# Patient Record
Sex: Female | Born: 1970 | Race: Black or African American | Hispanic: No | Smoking: Former smoker
Health system: Southern US, Community
[De-identification: ages and names within clinical notes are randomized; demographics above are authoritative.]

## PROBLEM LIST (undated history)

## (undated) DIAGNOSIS — E119 Type 2 diabetes mellitus without complications: Secondary | ICD-10-CM

## (undated) DIAGNOSIS — J45909 Unspecified asthma, uncomplicated: Secondary | ICD-10-CM

## (undated) DIAGNOSIS — F319 Bipolar disorder, unspecified: Secondary | ICD-10-CM

## (undated) DIAGNOSIS — I10 Essential (primary) hypertension: Secondary | ICD-10-CM

---

## 2014-12-21 ENCOUNTER — Emergency Department (HOSPITAL_COMMUNITY): Payer: Medicaid Other

## 2014-12-21 ENCOUNTER — Emergency Department (HOSPITAL_COMMUNITY)
Admission: EM | Admit: 2014-12-21 | Discharge: 2014-12-22 | Disposition: A | Discharge status: Other Acute Inpatient Hospital | Payer: Medicaid Other | Attending: Emergency Medicine | Admitting: Emergency Medicine

## 2014-12-21 ENCOUNTER — Encounter (HOSPITAL_COMMUNITY): Payer: Self-pay

## 2014-12-21 DIAGNOSIS — F1410 Cocaine abuse, uncomplicated: Secondary | ICD-10-CM | POA: Diagnosis not present

## 2014-12-21 DIAGNOSIS — F3163 Bipolar disorder, current episode mixed, severe, without psychotic features: Secondary | ICD-10-CM | POA: Diagnosis not present

## 2014-12-21 DIAGNOSIS — J45901 Unspecified asthma with (acute) exacerbation: Secondary | ICD-10-CM | POA: Insufficient documentation

## 2014-12-21 DIAGNOSIS — R4689 Other symptoms and signs involving appearance and behavior: Secondary | ICD-10-CM

## 2014-12-21 DIAGNOSIS — F3160 Bipolar disorder, current episode mixed, unspecified: Secondary | ICD-10-CM | POA: Diagnosis not present

## 2014-12-21 DIAGNOSIS — Y998 Other external cause status: Secondary | ICD-10-CM | POA: Diagnosis not present

## 2014-12-21 DIAGNOSIS — Y9389 Activity, other specified: Secondary | ICD-10-CM | POA: Insufficient documentation

## 2014-12-21 DIAGNOSIS — Y92009 Unspecified place in unspecified non-institutional (private) residence as the place of occurrence of the external cause: Secondary | ICD-10-CM | POA: Insufficient documentation

## 2014-12-21 DIAGNOSIS — F4310 Post-traumatic stress disorder, unspecified: Secondary | ICD-10-CM | POA: Diagnosis present

## 2014-12-21 DIAGNOSIS — Z87891 Personal history of nicotine dependence: Secondary | ICD-10-CM | POA: Insufficient documentation

## 2014-12-21 DIAGNOSIS — R0600 Dyspnea, unspecified: Secondary | ICD-10-CM | POA: Diagnosis not present

## 2014-12-21 DIAGNOSIS — F1310 Sedative, hypnotic or anxiolytic abuse, uncomplicated: Secondary | ICD-10-CM | POA: Diagnosis not present

## 2014-12-21 DIAGNOSIS — F911 Conduct disorder, childhood-onset type: Secondary | ICD-10-CM | POA: Diagnosis not present

## 2014-12-21 DIAGNOSIS — S199XXA Unspecified injury of neck, initial encounter: Secondary | ICD-10-CM | POA: Diagnosis not present

## 2014-12-21 DIAGNOSIS — Z79899 Other long term (current) drug therapy: Secondary | ICD-10-CM | POA: Diagnosis not present

## 2014-12-21 DIAGNOSIS — I10 Essential (primary) hypertension: Secondary | ICD-10-CM | POA: Insufficient documentation

## 2014-12-21 DIAGNOSIS — E119 Type 2 diabetes mellitus without complications: Secondary | ICD-10-CM | POA: Insufficient documentation

## 2014-12-21 DIAGNOSIS — R0602 Shortness of breath: Secondary | ICD-10-CM | POA: Diagnosis present

## 2014-12-21 DIAGNOSIS — R0603 Acute respiratory distress: Secondary | ICD-10-CM

## 2014-12-21 DIAGNOSIS — R45850 Homicidal ideations: Secondary | ICD-10-CM | POA: Diagnosis not present

## 2014-12-21 HISTORY — DX: Type 2 diabetes mellitus without complications: E11.9

## 2014-12-21 HISTORY — DX: Unspecified asthma, uncomplicated: J45.909

## 2014-12-21 HISTORY — DX: Bipolar disorder, unspecified: F31.9

## 2014-12-21 HISTORY — DX: Essential (primary) hypertension: I10

## 2014-12-21 LAB — I-STAT TROPONIN, ED: Troponin i, poc: 0.01 ng/mL (ref 0.00–0.08)

## 2014-12-21 LAB — BASIC METABOLIC PANEL
ANION GAP: 11 (ref 5–15)
BUN: 16 mg/dL (ref 6–20)
CO2: 23 mmol/L (ref 22–32)
Calcium: 9.6 mg/dL (ref 8.9–10.3)
Chloride: 105 mmol/L (ref 101–111)
Creatinine, Ser: 1.03 mg/dL — ABNORMAL HIGH (ref 0.44–1.00)
GFR calc Af Amer: >60 mL/min (ref >60)
GLUCOSE: 109 mg/dL — AB (ref 65–99)
POTASSIUM: 4.3 mmol/L (ref 3.5–5.1)
Sodium: 139 mmol/L (ref 135–145)

## 2014-12-21 LAB — CBC
HEMATOCRIT: 41 % (ref 36.0–46.0)
HEMOGLOBIN: 13.4 g/dL (ref 12.0–15.0)
MCH: 28.9 pg (ref 26.0–34.0)
MCHC: 32.7 g/dL (ref 30.0–36.0)
MCV: 88.6 fL (ref 78.0–100.0)
Platelets: 306 10*3/uL (ref 150–400)
RBC: 4.63 MIL/uL (ref 3.87–5.11)
RDW: 13 % (ref 11.5–15.5)
WBC: 9.1 10*3/uL (ref 4.0–10.5)

## 2014-12-21 MED ORDER — METFORMIN HCL 500 MG PO TABS
500.0000 mg | ORAL_TABLET | Freq: Every day | ORAL | Status: DC
  Administered 2014-12-22: 500 mg via ORAL
  Filled 2014-12-21 (×2): qty 1

## 2014-12-21 MED ORDER — MONTELUKAST SODIUM 10 MG PO TABS
10.0000 mg | ORAL_TABLET | Freq: Every evening | ORAL | Status: DC
  Administered 2014-12-21: 10 mg via ORAL
  Filled 2014-12-21 (×3): qty 1

## 2014-12-21 MED ORDER — ALBUTEROL SULFATE HFA 108 (90 BASE) MCG/ACT IN AERS: 1.0000 | INHALATION_SPRAY | RESPIRATORY_TRACT | Status: DC | PRN

## 2014-12-21 MED ORDER — GABAPENTIN 300 MG PO CAPS
300.0000 mg | ORAL_CAPSULE | Freq: Three times a day (TID) | ORAL | Status: DC
  Administered 2014-12-21 – 2014-12-22 (×4): 300 mg via ORAL
  Filled 2014-12-21 (×4): qty 1

## 2014-12-21 MED ORDER — LORAZEPAM 2 MG/ML IJ SOLN
1.0000 mg | Freq: Once | INTRAMUSCULAR | Status: AC
  Administered 2014-12-21: 1 mg via INTRAMUSCULAR
  Filled 2014-12-21: qty 1

## 2014-12-21 MED ORDER — ALBUTEROL SULFATE (2.5 MG/3ML) 0.083% IN NEBU
5.0000 mg | INHALATION_SOLUTION | Freq: Once | RESPIRATORY_TRACT | Status: AC
  Administered 2014-12-21: 5 mg via RESPIRATORY_TRACT
  Filled 2014-12-21: qty 6

## 2014-12-21 MED ORDER — HYDROCHLOROTHIAZIDE 25 MG PO TABS
25.0000 mg | ORAL_TABLET | Freq: Every day | ORAL | Status: DC
  Administered 2014-12-22: 25 mg via ORAL
  Filled 2014-12-21: qty 1

## 2014-12-21 MED ORDER — PANTOPRAZOLE SODIUM 40 MG PO TBEC
40.0000 mg | DELAYED_RELEASE_TABLET | Freq: Every day | ORAL | Status: DC
  Administered 2014-12-21 – 2014-12-22 (×2): 40 mg via ORAL
  Filled 2014-12-21 (×3): qty 1

## 2014-12-21 MED ORDER — MOMETASONE FURO-FORMOTEROL FUM 200-5 MCG/ACT IN AERO
2.0000 | INHALATION_SPRAY | Freq: Two times a day (BID) | RESPIRATORY_TRACT | Status: DC
  Filled 2014-12-21 (×2): qty 8.8

## 2014-12-21 MED ORDER — OXYCODONE HCL 5 MG PO TABS
5.0000 mg | ORAL_TABLET | Freq: Four times a day (QID) | ORAL | Status: DC | PRN
  Administered 2014-12-22: 5 mg via ORAL
  Filled 2014-12-21 (×2): qty 1

## 2014-12-21 MED ORDER — CLONAZEPAM 0.5 MG PO TABS
1.0000 mg | ORAL_TABLET | Freq: Two times a day (BID) | ORAL | Status: DC | PRN
  Filled 2014-12-21: qty 2

## 2014-12-21 NOTE — ED Notes (Signed)
Pt ambulated to the bathroom without assiatance

## 2014-12-21 NOTE — ED Notes (Signed)
Upon completing Triage, Pt began complaining of central chest pain.  Pt has denied chest pain w/ EMS.  Pt noted to be thrashing around in the chair and continually stating that she can not breathe.  This RN unsuccessfully attempted to calm and reassure the Pt that her oxygen level is 95%.

## 2014-12-21 NOTE — ED Notes (Signed)
Pt ambulated to bathroom and back to bed without assistance.

## 2014-12-21 NOTE — ED Notes (Signed)
Pt resting comfortably in the hallway bed.  NAD noted.

## 2014-12-21 NOTE — ED Notes (Signed)
Pt's husband: [phone]
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
## 2014-12-21 NOTE — ED Notes (Signed)
Pt [facility] to [facility] and [facility]. RN asked pt if she used CPAP or BiPap to sleep at night. Pt states "CPAP". When RN inquired as to if she needed it every night, pt states "Yes". Charge Nurse aware of statement. When pt informed that she will have to return to main ER, pt then began to recant her statement saying "I ain't sleeping in the hallway! You ain't taking me out there!". Pt irritable and argumentative with staff. Pt taken to Hallway C.
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
## 2014-12-21 NOTE — ED Notes (Signed)
Pt ambulated to bathroom without assistance. Ambulated with husband walking beside her.
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
## 2014-12-21 NOTE — Progress Notes (Signed)
Placed on auto titration BiPAP Imax 20 Emin 8 and 3L O2 bleed using full face mask. Did not tolerate nasal mask and asked for full face mask. Husband at bedside. Patient tolerated well and agrees the pressure and mask fit "feels okay". RN aware. RT will continue to follow.

## 2014-12-21 NOTE — ED Notes (Signed)
Pt ambulated to the bathroom with out assistance and got back in bed.

## 2014-12-21 NOTE — Progress Notes (Signed)
Visited with patient and her husband. Remains in the hallway. Patient asleep, moderate difficulty to awaken for conversation. Once awake, she is compliant and answers questions appropriately. She states she does use nocturnal CPAP but does not use it every night. Husband disagrees and states she wears it "alot" and sometimes during the day when she naps, as well as uses oxygen through it. The patient agrees that she uses nocturnal O2, which she states is "sometimes on 2L and other times up to 6L. The patient repositioned herself in the bed and drifted back to sleep. In conversing with her husband, he explains that his wife did not tolerate a CPAP mask of any kind well and therefore uses nasal pillows. He is aware that the pillow apparatus is not readily available in the hospital and [facility] past one night. He also expressed that the patient tolerated a nasal mask much better than a full face mask in the past. RN and charge RN both made aware of need for patient to be placed in a room. RT will continue to follow.

## 2014-12-21 NOTE — ED Notes (Signed)
Patient very dramatic. Yelling, would not hold still for ekg or IV start. Patient asked to be still multiple times and she pulled away when author attempted to start IV. Primary nurse made aware.

## 2014-12-22 ENCOUNTER — Inpatient Hospital Stay (HOSPITAL_COMMUNITY)
Admission: AD | Admit: 2014-12-22 | Discharge: 2014-12-27 | DRG: 885 | Disposition: A | Discharge status: Home / Self Care | Payer: MEDICAID | Source: Intra-hospital | Attending: Psychiatry | Admitting: Psychiatry

## 2014-12-22 ENCOUNTER — Encounter (HOSPITAL_COMMUNITY): Payer: Self-pay | Admitting: General Practice

## 2014-12-22 DIAGNOSIS — R45850 Homicidal ideations: Secondary | ICD-10-CM | POA: Diagnosis present

## 2014-12-22 DIAGNOSIS — Z87891 Personal history of nicotine dependence: Secondary | ICD-10-CM | POA: Diagnosis not present

## 2014-12-22 DIAGNOSIS — F3163 Bipolar disorder, current episode mixed, severe, without psychotic features: Secondary | ICD-10-CM

## 2014-12-22 DIAGNOSIS — F3160 Bipolar disorder, current episode mixed, unspecified: Secondary | ICD-10-CM | POA: Diagnosis present

## 2014-12-22 DIAGNOSIS — F4310 Post-traumatic stress disorder, unspecified: Secondary | ICD-10-CM | POA: Diagnosis present

## 2014-12-22 DIAGNOSIS — Z6841 Body Mass Index (BMI) 40.0 and over, adult: Secondary | ICD-10-CM

## 2014-12-22 DIAGNOSIS — E119 Type 2 diabetes mellitus without complications: Secondary | ICD-10-CM | POA: Diagnosis present

## 2014-12-22 DIAGNOSIS — F1420 Cocaine dependence, uncomplicated: Secondary | ICD-10-CM | POA: Diagnosis present

## 2014-12-22 DIAGNOSIS — E785 Hyperlipidemia, unspecified: Secondary | ICD-10-CM | POA: Diagnosis present

## 2014-12-22 DIAGNOSIS — F3164 Bipolar disorder, current episode mixed, severe, with psychotic features: Principal | ICD-10-CM | POA: Diagnosis present

## 2014-12-22 DIAGNOSIS — R4689 Other symptoms and signs involving appearance and behavior: Secondary | ICD-10-CM | POA: Insufficient documentation

## 2014-12-22 DIAGNOSIS — I10 Essential (primary) hypertension: Secondary | ICD-10-CM | POA: Diagnosis present

## 2014-12-22 DIAGNOSIS — R0600 Dyspnea, unspecified: Secondary | ICD-10-CM | POA: Diagnosis not present

## 2014-12-22 DIAGNOSIS — F319 Bipolar disorder, unspecified: Secondary | ICD-10-CM | POA: Diagnosis present

## 2014-12-22 LAB — URINE MICROSCOPIC-ADD ON

## 2014-12-22 LAB — URINALYSIS, ROUTINE W REFLEX MICROSCOPIC
Glucose, UA: NEGATIVE mg/dL
KETONES UR: NEGATIVE mg/dL
NITRITE: NEGATIVE
PROTEIN: NEGATIVE mg/dL
SPECIFIC GRAVITY, URINE: 1.028 (ref 1.005–1.030)
pH: 6 (ref 5.0–8.0)

## 2014-12-22 LAB — RAPID URINE DRUG SCREEN, HOSP PERFORMED
Amphetamines: NOT DETECTED
Barbiturates: NOT DETECTED
Benzodiazepines: POSITIVE — AB
COCAINE: POSITIVE — AB
OPIATES: NOT DETECTED
TETRAHYDROCANNABINOL: NOT DETECTED

## 2014-12-22 MED ORDER — MONTELUKAST SODIUM 10 MG PO TABS
10.0000 mg | ORAL_TABLET | Freq: Every evening | ORAL | Status: DC
  Administered 2014-12-22 – 2014-12-26 (×5): 10 mg via ORAL
  Filled 2014-12-22 (×8): qty 1

## 2014-12-22 MED ORDER — ALBUTEROL SULFATE HFA 108 (90 BASE) MCG/ACT IN AERS
1.0000 | INHALATION_SPRAY | RESPIRATORY_TRACT | Status: DC | PRN
  Administered 2014-12-26: 1 via RESPIRATORY_TRACT
  Filled 2014-12-22: qty 6.7

## 2014-12-22 MED ORDER — GABAPENTIN 300 MG PO CAPS
300.0000 mg | ORAL_CAPSULE | Freq: Three times a day (TID) | ORAL | Status: DC
  Administered 2014-12-22 – 2014-12-24 (×6): 300 mg via ORAL
  Filled 2014-12-22 (×12): qty 1

## 2014-12-22 MED ORDER — CITALOPRAM HYDROBROMIDE 20 MG PO TABS
20.0000 mg | ORAL_TABLET | Freq: Every day | ORAL | Status: DC
  Filled 2014-12-22: qty 1

## 2014-12-22 MED ORDER — ACETAMINOPHEN 325 MG PO TABS: 650.0000 mg | ORAL_TABLET | Freq: Four times a day (QID) | ORAL | Status: DC | PRN

## 2014-12-22 MED ORDER — TRAZODONE HCL 100 MG PO TABS: 100.0000 mg | ORAL_TABLET | Freq: Every day | ORAL | Status: DC

## 2014-12-22 MED ORDER — LOPERAMIDE HCL 2 MG PO CAPS
4.0000 mg | ORAL_CAPSULE | Freq: Once | ORAL | Status: AC
Start: 2014-12-22 — End: 2014-12-22
  Administered 2014-12-22: 4 mg via ORAL
  Filled 2014-12-22 (×2): qty 2

## 2014-12-22 MED ORDER — NAPROXEN 500 MG PO TABS
500.0000 mg | ORAL_TABLET | Freq: Two times a day (BID) | ORAL | Status: DC
  Administered 2014-12-22 – 2014-12-27 (×10): 500 mg via ORAL
  Filled 2014-12-22 (×15): qty 1

## 2014-12-22 MED ORDER — CITALOPRAM HYDROBROMIDE 20 MG PO TABS
20.0000 mg | ORAL_TABLET | Freq: Every day | ORAL | Status: DC
Start: 2014-12-22 — End: 2014-12-23
  Administered 2014-12-22: 20 mg via ORAL
  Filled 2014-12-22 (×4): qty 1

## 2014-12-22 MED ORDER — TRAZODONE HCL 100 MG PO TABS
100.0000 mg | ORAL_TABLET | Freq: Every day | ORAL | Status: DC
  Administered 2014-12-22: 100 mg via ORAL
  Filled 2014-12-22 (×4): qty 1

## 2014-12-22 MED ORDER — MAGNESIUM HYDROXIDE 400 MG/5ML PO SUSP
30.0000 mL | Freq: Every day | ORAL | Status: DC | PRN
  Administered 2014-12-26: 30 mL via ORAL
  Filled 2014-12-22: qty 30

## 2014-12-22 MED ORDER — ALUM & MAG HYDROXIDE-SIMETH 200-200-20 MG/5ML PO SUSP: 30.0000 mL | ORAL | Status: DC | PRN

## 2014-12-22 MED ORDER — LOPERAMIDE HCL 2 MG PO CAPS
2.0000 mg | ORAL_CAPSULE | ORAL | Status: DC | PRN
  Administered 2014-12-27 (×2): 2 mg via ORAL
  Filled 2014-12-22: qty 1
  Filled 2014-12-22: qty 2

## 2014-12-22 MED ORDER — METHOCARBAMOL 500 MG PO TABS
500.0000 mg | ORAL_TABLET | Freq: Four times a day (QID) | ORAL | Status: DC | PRN
  Administered 2014-12-23 – 2014-12-26 (×6): 500 mg via ORAL
  Filled 2014-12-22 (×6): qty 1

## 2014-12-22 MED ORDER — HYDROXYZINE HCL 50 MG PO TABS
50.0000 mg | ORAL_TABLET | Freq: Four times a day (QID) | ORAL | Status: DC | PRN
  Administered 2014-12-22: 50 mg via ORAL
  Filled 2014-12-22: qty 1

## 2014-12-22 MED ORDER — PANTOPRAZOLE SODIUM 40 MG PO TBEC
40.0000 mg | DELAYED_RELEASE_TABLET | Freq: Every day | ORAL | Status: DC
  Administered 2014-12-23 – 2014-12-27 (×5): 40 mg via ORAL
  Filled 2014-12-22 (×7): qty 1

## 2014-12-22 MED ORDER — MOMETASONE FURO-FORMOTEROL FUM 200-5 MCG/ACT IN AERO
2.0000 | INHALATION_SPRAY | Freq: Two times a day (BID) | RESPIRATORY_TRACT | Status: DC
  Administered 2014-12-22 – 2014-12-27 (×9): 2 via RESPIRATORY_TRACT
  Filled 2014-12-22 (×2): qty 8.8

## 2014-12-22 MED ORDER — METFORMIN HCL 500 MG PO TABS
500.0000 mg | ORAL_TABLET | Freq: Every day | ORAL | Status: DC
  Administered 2014-12-25 – 2014-12-27 (×2): 500 mg via ORAL
  Filled 2014-12-22 (×7): qty 1

## 2014-12-22 MED ORDER — HYDROCHLOROTHIAZIDE 25 MG PO TABS
25.0000 mg | ORAL_TABLET | Freq: Every day | ORAL | Status: DC
  Administered 2014-12-23 – 2014-12-27 (×5): 25 mg via ORAL
  Filled 2014-12-22 (×7): qty 1

## 2014-12-22 NOTE — ED Notes (Signed)
Previous departure charting was on the wrong patient

## 2014-12-22 NOTE — ED Notes (Signed)
Pt irritated that she will not have a shower in her room and that she "stinks."  Pt informed that there is a shower on the unit.  Pt continually stating that she [facility], because "they know me.  They know how to take care of me."  Pt and boyfriend informed that he could not bring her outside food or drinks and boyfriend verbalized understanding.  Pt upset that she can not have food from the vending machines.  Sts she is pre-diabetic and does not have diet restrictions.  As pt's boyfriend was directed to the lobby, Pt began stating "they better discharge me.  They aren't taking my husband from me.  He came [place] the way from [place].  I need him w/ me."  Pt's boyfriend informed this RN that he was needing to leave and "get some sleep."  Boyfriend seemed very agitated w/ the Pt.
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
## 2014-12-22 NOTE — ED Notes (Signed)
Conversation with providers to determine that patient is SAPPU appropriated. It is very well documented that patient ambulates independently without assistive devices and that she refused to were the CPAP during the night without difficulty.
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
## 2014-12-22 NOTE — ED Notes (Signed)
Pt ambulated to SAPPU w/o assistance.

## 2014-12-22 NOTE — Tx Team (Signed)
Initial Interdisciplinary Treatment Plan   PATIENT STRESSORS: Marital or family conflict Medication change or noncompliance   PATIENT STRENGTHS: Active sense of humor Communication skills   PROBLEM LIST: Problem List/Patient Goals Date to be addressed Date deferred Reason deferred Estimated date of resolution  Aggression 12/22/2014           Bipolar Disorder 12/22/2014           "get out of here"-referring to discharge 12/22/2014                              DISCHARGE CRITERIA:  Ability to meet basic life and health needs Improved stabilization in mood, thinking, and/or behavior Verbal commitment to aftercare and medication compliance  PRELIMINARY DISCHARGE PLAN: Outpatient therapy Participate in family therapy  PATIENT/FAMIILY INVOLVEMENT: This treatment plan has been presented to and reviewed with the patient, Sherryl MangesBeauica Mcclean.  The patient and family have been given the opportunity to ask questions and make suggestions.  Marzetta BoardDopson, Sharion Grieves E 12/22/2014, 7:02 PM

## 2014-12-22 NOTE — Progress Notes (Addendum)
pcp is [facility] [address] [place], [place] [phone]  Medicaid [place] Access Covered Patient [email or web address] USE THIS WEBSITE TO ASSIST WITH UNDERSTANDING YOUR COVERAGE, RENEW APPLICATION As a Medicaid client you MUST contact DSS/SSI each time you change address, move to another [place] county or another state to keep your address updated   Lorayne BenderRandolph, [place] Schedule an appointment as soon as possible for a visit This is your assigned Medicaid [place] access doctor If you prefer another contact DSS [phone] DSS assigned your doctor *You may receive a bill if you go to any family Dr not assigned to you [address] [facility] [place] [id number] [phone]

## 2014-12-22 NOTE — ED Notes (Addendum)
Pt requesting pain medication.  Pain medication was brought into the room and pt was sleeping.  Pain medication was returned. Pt observed to have taken off her bipap machine but is sleeping comfortably.  Easy rise and fall of chest. NAD noted.

## 2014-12-22 NOTE — ED Notes (Signed)
Pt is upset that we are not giving her all of her home medications.  This RN asked a Pharmacy Tech to reconcile medications.
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
## 2014-12-22 NOTE — Progress Notes (Signed)
Adult Psychoeducational Group Note  Date:  12/22/2014 Time:  9:10 PM  Group Topic/Focus:  Wrap-Up Group:   The focus of this group is to help patients review their daily goal of treatment and discuss progress on daily workbooks.  Participation Level:  Did Not Attend  Participation Quality:  Did not attend  Affect:  Did not attend  Cognitive:  Did not attend  Insight: None  Engagement in Group:  Did not attend  Modes of Intervention:  Did not attend  Additional Comments:  Patient did not attend wrap up group tonight.   [name] L [name] 12/22/2014, 9:10 PM

## 2014-12-22 NOTE — ED Notes (Addendum)
Author attempt to speak with [facility] provider, but they are rounding at this time. Patient was not accepted in [facility] today because sometimes ambulates with cane per notes. However, patient came to the department on yesterday and has not required any assistive devices. She ambulates independently. Patient was in [facility] last night and ambulation was not an issue at that time and has not been this admission. Patient stated that she wears CPAP at home which she refuses to wear here and it is not required nor needed at this time. Author will attempt to speak with provider again in regards to this patient. BH AC made aware.
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
## 2014-12-22 NOTE — ED Notes (Signed)
Pt continues to keep her bipap mask off.  Pt was asked if she would like to put the bipap mask on, she declined.

## 2014-12-22 NOTE — BH Assessment (Signed)
[facility] Assessment Progress Note  Per Thedore MinsMojeed Akintayo, MD, this pt requires psychiatric hospitalization at this time.  He has initiated IVC.  Thurman CoyerEric Kaplan, RN, [facility].  [facility].  Pt's nurse, Kendal Hymendie, has been notified.  She agrees to call report to [phone].  Pt is to be transported via Patent examinerlaw enforcement.  Doylene Canninghomas Shikita Vaillancourt, MA Triage Specialist [phone]

## 2014-12-23 ENCOUNTER — Encounter (HOSPITAL_COMMUNITY): Payer: Self-pay | Admitting: Psychiatry

## 2014-12-23 LAB — GLUCOSE, CAPILLARY: Glucose-Capillary: 135 mg/dL — ABNORMAL HIGH (ref 65–99)

## 2014-12-23 MED ORDER — AMLODIPINE BESYLATE 5 MG PO TABS
5.0000 mg | ORAL_TABLET | Freq: Every day | ORAL | Status: DC
  Administered 2014-12-24 – 2014-12-27 (×3): 5 mg via ORAL
  Filled 2014-12-23 (×6): qty 1

## 2014-12-23 MED ORDER — THIAMINE HCL 100 MG/ML IJ SOLN: 100.0000 mg | Freq: Once | INTRAMUSCULAR | Status: DC

## 2014-12-23 MED ORDER — LORAZEPAM 1 MG PO TABS: 1.0000 mg | ORAL_TABLET | Freq: Two times a day (BID) | ORAL | Status: DC

## 2014-12-23 MED ORDER — LORAZEPAM 1 MG PO TABS: 1.0000 mg | ORAL_TABLET | Freq: Every day | ORAL | Status: DC

## 2014-12-23 MED ORDER — LORAZEPAM 1 MG PO TABS: 1.0000 mg | ORAL_TABLET | Freq: Three times a day (TID) | ORAL | Status: DC

## 2014-12-23 MED ORDER — ADULT MULTIVITAMIN W/MINERALS CH
1.0000 | ORAL_TABLET | Freq: Every day | ORAL | Status: DC
  Administered 2014-12-23 – 2014-12-27 (×5): 1 via ORAL
  Filled 2014-12-23 (×8): qty 1

## 2014-12-23 MED ORDER — TRAZODONE HCL 50 MG PO TABS
50.0000 mg | ORAL_TABLET | Freq: Every evening | ORAL | Status: DC | PRN
  Administered 2014-12-23 – 2014-12-25 (×3): 50 mg via ORAL
  Filled 2014-12-23 (×3): qty 1

## 2014-12-23 MED ORDER — ONDANSETRON 4 MG PO TBDP: 4.0000 mg | ORAL_TABLET | Freq: Four times a day (QID) | ORAL | Status: AC | PRN

## 2014-12-23 MED ORDER — CITALOPRAM HYDROBROMIDE 10 MG PO TABS
10.0000 mg | ORAL_TABLET | Freq: Every day | ORAL | Status: DC
  Administered 2014-12-24: 10 mg via ORAL
  Filled 2014-12-23 (×4): qty 1

## 2014-12-23 MED ORDER — HYDROXYZINE HCL 25 MG PO TABS
25.0000 mg | ORAL_TABLET | Freq: Four times a day (QID) | ORAL | Status: AC | PRN
  Administered 2014-12-23 – 2014-12-25 (×3): 25 mg via ORAL
  Filled 2014-12-23 (×3): qty 1

## 2014-12-23 MED ORDER — LORAZEPAM 1 MG PO TABS: 1.0000 mg | ORAL_TABLET | Freq: Four times a day (QID) | ORAL | Status: DC

## 2014-12-23 MED ORDER — VITAMIN B-1 100 MG PO TABS
100.0000 mg | ORAL_TABLET | Freq: Every day | ORAL | Status: DC
  Administered 2014-12-24 – 2014-12-27 (×4): 100 mg via ORAL
  Filled 2014-12-23 (×6): qty 1

## 2014-12-23 MED ORDER — AMLODIPINE BESYLATE 10 MG PO TABS
10.0000 mg | ORAL_TABLET | Freq: Once | ORAL | Status: AC
  Administered 2014-12-23: 10 mg via ORAL
  Filled 2014-12-23 (×2): qty 1

## 2014-12-23 MED ORDER — LORAZEPAM 1 MG PO TABS
1.0000 mg | ORAL_TABLET | Freq: Four times a day (QID) | ORAL | Status: AC | PRN
  Administered 2014-12-26: 1 mg via ORAL
  Filled 2014-12-23: qty 1

## 2014-12-23 MED ORDER — LOPERAMIDE HCL 2 MG PO CAPS: 2.0000 mg | ORAL_CAPSULE | ORAL | Status: DC | PRN

## 2014-12-23 NOTE — [facility] Group Notes (Signed)
[facility] LCSW Group Therapy 12/23/2014 1:15 PM  Type of Therapy: Group Therapy- Feelings about Diagnosis  Pt did not attend, declined invitation.   Chad CordialLauren Carter, LCSWA 12/23/2014 2:56 PM

## 2014-12-23 NOTE — [facility] Group Notes (Signed)
[facility] Group Notes: (Nursing/MHT/Case Management/Adjunct)  Date: 12/23/2014  Time: 10:06 AM  Type of Therapy: Psychoeducational Skills  Participation Level: Did Not Attend  Participation Quality: N/A  Affect: N/A  Cognitive: N/A  Insight: None  Engagement in Group: None  Modes of Intervention: Discussion and Education  Summary of Progress/Problems: Patient was invited to group but chose not to attend.   Xitlally Mooneyham E 12/23/2014, 10:06 AM
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
## 2014-12-23 NOTE — Plan of Care (Signed)
Problem: Ineffective individual coping Goal: STG: Patient will remain free from self harm Outcome: Progressing Remains free from self harm at this time.

## 2014-12-23 NOTE — Tx Team (Signed)
Interdisciplinary Treatment Plan Update (Adult) Date: 12/23/2014   Date: 12/23/2014 3:22 PM  Progress in Treatment:  Attending groups: Candice [name] is new to milieu, continuing to assess  Participating in groups: Candice [name] is new to milieu, continuing to assess  Taking medication as prescribed: Yes  Tolerating medication: Yes  Family/Significant othe contact made: No, CSW assessing for appropriate contact Patient understands diagnosis: Continuing to assess Discussing patient identified problems/goals with staff: Yes  Medical problems stabilized or resolved: Yes  Denies suicidal/homicidal ideation: No, Candice [name] endorses HI toward brother Patient has not harmed self or Others: Yes   New problem(s) identified: None identified at this time.   Discharge Plan or Barriers: Candice [name] will return home to [facility] and follow-up with outpatient providers  Additional comments:  Patient and CSW reviewed Candice [name]'s identified goals and treatment plan. Patient verbalized understanding and agreed to treatment plan. CSW reviewed [facility] "Discharge Process and Patient Involvement" Form. Candice [name] verbalized understanding of information provided and signed form.   Reason for Continuation of Hospitalization:  Medication stabilization Withdrawal symptoms Mania  Estimated length of stay: 3-5 days  Review of initial/current patient goals per problem list:   1.  Goal(s): Patient will participate in aftercare plan  Met:  Yes  Target date: 3-5 days from date of admission   As evidenced by: Patient will participate within aftercare plan AEB aftercare provider and housing plan at discharge being identified.   12/23/14: Candice [name] will discharge home and follow-up with outpatient resources  6. Goal (s):  Patient will demonstrate decreased signs of mania . Met:  No . Target date: 3-5 days from date of admission  . As evidenced by:  Patient demonstrate decreased signs of mania AEB decreased mood lability and demonstration of stable mood    -12/23/14: Candice [name]  admitted in mixed episode with mood lability, aggression, and HI towards brother.  Attendees:  Patient:    Family:    Physician: Dr. Parke Poisson, MD  12/23/2014 3:22 PM  Nursing: Lars Pinks, RN Case manager  12/23/2014 3:22 PM  Clinical Social Worker Peri Maris, [place] 12/23/2014 3:22 PM  Other:  12/23/2014 3:22 PM  Clinical: Gaylan Gerold RN 12/23/2014 3:22 PM  Other: , RN Charge Nurse 12/23/2014 3:22 PM  Other:     Peri Maris, [place] Social Work [phone]

## 2014-12-23 NOTE — Progress Notes (Signed)
Patient ID: Candice [name], female   DOB: [birth year], 44 y.o.   MRN: [id number]  DAR: Pt. Denies SI and A/V Hallucinations. She continues to report HI towards her brother but does not express any plan at this time. Patient does continue report pain in hip, legs, shoulder and back and rates pain at 9/10. She is receiving both scheduled and PRN medications for this. Support and encouragement provided to the patient however patient refuses to go to group. Scheduled medications administered to patient per physician's orders. Patient remains in bed throughout most of the day but will get up for meals, snacks, meds and the telephone. Q15 minute checks are maintained for safety.
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
## 2014-12-23 NOTE — Progress Notes (Signed)
Adult Psychoeducational Group Note  Date:  12/23/2014 Time:  9:10 PM  Group Topic/Focus:  Wrap-Up Group:   The focus of this group is to help patients review their daily goal of treatment and discuss progress on daily workbooks.  Participation Level:  Did Not Attend  Participation Quality:  Did not attend  Affect:  Did not attend  Cognitive:  Did not attend  Insight: None  Engagement in Group:  Did not attend  Modes of Intervention:  Did not attend  Additional Comments:  Patient did not attend wrap up group tonight.   Marg Macmaster L Tymere Depuy 12/23/2014, 9:10 PM

## 2014-12-23 NOTE — Progress Notes (Signed)
Recreation Therapy Notes  Animal-Assisted Activity (AAA) Program Checklist/Progress Notes Patient Eligibility Criteria Checklist & Daily Group note for Rec Tx Intervention  Date: 12.06.2016 Time: 2:45pm [address]    AAA/T Program Assumption of Risk Form signed by Patient/ or Parent Legal Guardian NO. Patient refused to sign at admission.   Marykay Lexenise L Asti Mackley, LRT/CTRS  [place]righton Delio L 12/23/2014 3:01 PM

## 2014-12-24 LAB — LIPID PANEL
CHOL/HDL RATIO: 5.7 ratio
CHOLESTEROL: 195 mg/dL (ref 0–200)
HDL: 34 mg/dL — AB (ref >40)
LDL Cholesterol: 108 mg/dL — ABNORMAL HIGH (ref 0–99)
Triglycerides: 267 mg/dL — ABNORMAL HIGH (ref <150)
VLDL: 53 mg/dL — ABNORMAL HIGH (ref 0–40)

## 2014-12-24 LAB — GLUCOSE, CAPILLARY: Glucose-Capillary: 106 mg/dL — ABNORMAL HIGH (ref 65–99)

## 2014-12-24 LAB — TSH: TSH: 1.477 u[IU]/mL (ref 0.350–4.500)

## 2014-12-24 MED ORDER — GABAPENTIN 400 MG PO CAPS
400.0000 mg | ORAL_CAPSULE | Freq: Three times a day (TID) | ORAL | Status: DC
  Administered 2014-12-24 – 2014-12-27 (×9): 400 mg via ORAL
  Filled 2014-12-24 (×14): qty 1

## 2014-12-24 MED ORDER — CITALOPRAM HYDROBROMIDE 20 MG PO TABS
20.0000 mg | ORAL_TABLET | Freq: Every day | ORAL | Status: DC
  Administered 2014-12-24: 20 mg via ORAL
  Filled 2014-12-24 (×3): qty 1
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
## 2014-12-24 NOTE — [facility] Group Notes (Signed)
[facility] LCSW Group Therapy 12/24/2014 1:15 PM  Type of Therapy: Group Therapy- Emotion Regulation  Pt did not attend, declined invitation.   Jahnavi Muratore Carter, LCSWA 12/24/2014 2:30 PM  

## 2014-12-24 NOTE — Progress Notes (Addendum)
Fall Note: At 1718, pt had an unwitnessed fall. Pt was standing in the hallway, leaning up against the wall with walker. Pt was observed, lying in a supine position, with walker on top of her. Pt reported that she did hit her head on the floor, when she fell down. Pt reported that was feeling dizzy when she fell. Pt denies any new onset of pain, neuro checks wnl, no pain on ROM and no suspected head injury. Pt v/s: B/P 152/78, Resp 18, O2 99%, P 84, CBG 139. Pt assessed by Cobos, NP., at 1720. Pt given a wheelchair and also have walker available.   It was verbalized by MHT, assigned to the [address], that she over heard this pt, telling other pts, in the dayroom, that she was going to fall in order to get her pain meds (oxycodone).  During initial assessment, pt verbalized to another RN, that if she had her Oxy, she wouldn't have fallen.  Pt verbalized to Renata Capriceonrad, NP., that Dr. Jama Flavorsobos is considering restarting her Oxy tomorrow 12/25/14.    No injury noted at this time. Writer will continue to monitor pt per post fall guidelines.
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
## 2014-12-25 DIAGNOSIS — F3163 Bipolar disorder, current episode mixed, severe, without psychotic features: Secondary | ICD-10-CM

## 2014-12-25 DIAGNOSIS — R45850 Homicidal ideations: Secondary | ICD-10-CM

## 2014-12-25 LAB — HEMOGLOBIN A1C
HEMOGLOBIN A1C: 6.5 % — AB (ref 4.8–5.6)
MEAN PLASMA GLUCOSE: 140 mg/dL

## 2014-12-25 MED ORDER — CITALOPRAM HYDROBROMIDE 10 MG PO TABS
10.0000 mg | ORAL_TABLET | Freq: Every day | ORAL | Status: DC
  Administered 2014-12-25 – 2014-12-26 (×2): 10 mg via ORAL
  Filled 2014-12-25 (×4): qty 1

## 2014-12-25 MED ORDER — ARIPIPRAZOLE 5 MG PO TABS
5.0000 mg | ORAL_TABLET | Freq: Every day | ORAL | Status: DC
  Administered 2014-12-25 – 2014-12-26 (×2): 5 mg via ORAL
  Filled 2014-12-25 (×4): qty 1
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
## 2014-12-25 NOTE — [facility] Suicide Risk Assessment (Signed)
[facility] INPATIENT:  Family/Significant Other Suicide Prevention Education  Suicide Prevention Education:  Education Completed; Griffin DakinRobert Clarke, Pt's boyfriend [phone],  has been identified by the patient as the family member/significant other with whom the patient will be residing, and identified as the person(s) who will aid the patient in the event of a mental health crisis (suicidal ideations/suicide attempt).  With written consent from the patient, the family member/significant other has been provided the following suicide prevention education, prior to the and/or following the discharge of the patient.  The suicide prevention education provided includes the following:  Suicide risk factors  Suicide prevention and interventions  National Suicide Hotline telephone number  [facility] assessment telephone number  [facility] Emergency Assistance 911  [facility] and/or Residential Mobile Crisis Unit telephone number  Request made of family/significant other to:  Remove weapons (e.g., guns, rifles, knives), all items previously/currently identified as safety concern.    Remove drugs/medications (over-the-counter, prescriptions, illicit drugs), all items previously/currently identified as a safety concern.  The family member/significant other verbalizes understanding of the suicide prevention education information provided.  The family member/significant other agrees to remove the items of safety concern listed above.  Elaina Hoopsarter, [place] Nordmeyer M 12/25/2014, 9:04 AM
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
## 2014-12-25 NOTE — Progress Notes (Addendum)
Patient ID: Candice [name], female   DOB: 30-Mar-1970, 44 y.o.   MRN: [id number] [facility] MD Progress Note  12/25/2014 1:18 PM Candice [name]  MRN:  [id number] Subjective:   Patient reports she is " doing OK".  Denies medication side effects. States she still has thoughts of hurting her brother, but states that she is less angry with him at this time, and more focused on " wanting to get back to my husband soon".   Objective :  I have discussed case with treatment team and have met with patient. As discussed with staff , patient had unwitnessed fall yesterday - see report on Nursing Notes . She states she did not have LOC, and there was no indication of bruising. (Of note, as per staff,  patient was overheard stating she was going to fall in order to try to get opiates prescribed .) Today patient denies headache, denies visual disturbances, denies vomiting, and chronic pain has not changed in severity. Of note, she does not present medication/opiate seeking at this time, and did not focus on pain today.  Rather, she spoke about missing her husband and hoping to be able to discharge soon. She reports ongoing homicidal ideations towards brother, but seems less focused on this and as on previous visit, states " I just need a little time to get over it ".  One stressor is current housing difficulties, but states she thinks her husband is about to find a place for them to live in [place], where they had been residing up to recently. Patient minimizes reports of increased irritability, erratic , dangerous behaviors exhibited prior to her admission, such as report taht she was waving a knife at people. States " I guess it was a little too much", but states she is feeling better now. She is not presenting with any pressured speech, racing thoughts, nor does she present irritable at this time. Denies medication side effects   Principal Problem: Bipolar affective disorder, current episode severe ([place]) Diagnosis:    Patient Active Problem List   Diagnosis Date Noted  . Bipolar affective disorder, current episode mixed, without psychotic features ([place]) [F31.60] 12/22/2014  . Homicidal ideations [R45.850] 12/22/2014  . Posttraumatic stress disorder [F43.10] 12/22/2014  . Bipolar affective disorder, current episode severe ([place]) [F31.9] 12/22/2014  . Aggressive behavior [F60.89]    Total Time spent with patient: 20 minutes    Past Medical History:  Past Medical History  Diagnosis Date  . Asthma   . Bipolar disorder ([place])   . Diabetes mellitus without complication ([place])   . Hypertension    History reviewed. No pertinent past surgical history. Family History: History reviewed. No pertinent family history.  Social History:  History  Alcohol Use No     History  Drug Use  . Yes  . Special: Marijuana    Social History   Social History  . Marital Status: Widowed    Spouse Name: N/A  . Number of Children: N/A  . Years of Education: N/A   Social History Main Topics  . Smoking status: Former Research scientist (life sciences)  . Smokeless tobacco: None  . Alcohol Use: No  . Drug Use: Yes    Special: Marijuana  . Sexual Activity: Yes    Birth Control/ Protection: Injection   Other Topics Concern  . None   Social History Narrative   Additional Social History:   Sleep: Fair  Appetite:  Good  Current Medications: Current Facility-Administered Medications  Medication Dose Route Frequency Provider Last Rate Last  Dose  . acetaminophen (TYLENOL) tablet 650 mg  650 mg Oral Q6H PRN Patrecia Pour, NP      . albuterol (PROVENTIL HFA;VENTOLIN HFA) 108 (90 BASE) MCG/ACT inhaler 1 puff  1 puff Inhalation Q4H PRN Patrecia Pour, NP      . alum & mag hydroxide-simeth (MAALOX/MYLANTA) 200-200-20 MG/5ML suspension 30 mL  30 mL Oral Q4H PRN Patrecia Pour, NP      . amLODipine (NORVASC) tablet 5 mg  5 mg Oral Daily Encarnacion Slates, NP   5 mg at 12/25/14 0737  . citalopram (CELEXA) tablet 20 mg  20 mg Oral QHS Jenne Campus, MD   20 mg at 12/24/14 2204  . gabapentin (NEURONTIN) capsule 400 mg  400 mg Oral TID Jenne Campus, MD   400 mg at 12/25/14 1200  . hydrochlorothiazide (HYDRODIURIL) tablet 25 mg  25 mg Oral Daily Patrecia Pour, NP   25 mg at 12/25/14 0737  . hydrOXYzine (ATARAX/VISTARIL) tablet 25 mg  25 mg Oral Q6H PRN Jenne Campus, MD   25 mg at 12/24/14 2205  . loperamide (IMODIUM) capsule 2 mg  2 mg Oral Q4H PRN Benjamine Mola, FNP      . LORazepam (ATIVAN) tablet 1 mg  1 mg Oral Q6H PRN Myer Peer Cobos, MD      . magnesium hydroxide (MILK OF MAGNESIA) suspension 30 mL  30 mL Oral Daily PRN Patrecia Pour, NP      . metFORMIN (GLUCOPHAGE) tablet 500 mg  500 mg Oral Q breakfast Patrecia Pour, NP   500 mg at 12/25/14 0737  . methocarbamol (ROBAXIN) tablet 500 mg  500 mg Oral Q6H PRN Laverle Hobby, PA-C   500 mg at 12/24/14 2205  . mometasone-formoterol (DULERA) 200-5 MCG/ACT inhaler 2 puff  2 puff Inhalation BID Patrecia Pour, NP   2 puff at 12/25/14 [phone]  . montelukast (SINGULAIR) tablet 10 mg  10 mg Oral QPM Patrecia Pour, NP   10 mg at 12/24/14 1632  . multivitamin with minerals tablet 1 tablet  1 tablet Oral Daily Jenne Campus, MD   1 tablet at 12/25/14 0738  . naproxen (NAPROSYN) tablet 500 mg  500 mg Oral BID WC Laverle Hobby, PA-C   500 mg at 12/25/14 8466  . ondansetron (ZOFRAN-ODT) disintegrating tablet 4 mg  4 mg Oral Q6H PRN Jenne Campus, MD      . pantoprazole (PROTONIX) EC tablet 40 mg  40 mg Oral Daily Patrecia Pour, NP   40 mg at 12/25/14 0738  . thiamine (B-1) injection 100 mg  100 mg Intramuscular Once Jenne Campus, MD   100 mg at 12/23/14 1739  . thiamine (VITAMIN B-1) tablet 100 mg  100 mg Oral Daily Jenne Campus, MD   100 mg at 12/25/14 0739  . traZODone (DESYREL) tablet 50 mg  50 mg Oral QHS PRN Jenne Campus, MD   50 mg at 12/24/14 2205    Lab Results:  Results for orders placed or performed during the hospital encounter of 12/22/14 (from the  past 48 hour(s))  Glucose, capillary     Status: Abnormal   Collection Time: 12/24/14  6:23 AM  Result Value Ref Range   Glucose-Capillary 106 (H) 65 - 99 mg/dL   Comment 1 Notify RN   TSH     Status: None   Collection Time: 12/24/14  6:48 AM  Result Value  Ref Range   TSH 1.477 0.350 - 4.500 uIU/mL    Comment: Performed at [facility]  Lipid panel     Status: Abnormal   Collection Time: 12/24/14  6:48 AM  Result Value Ref Range   Cholesterol 195 0 - 200 mg/dL   Triglycerides 267 (H) <150 mg/dL   HDL 34 (L) >40 mg/dL   Total CHOL/HDL Ratio 5.7 RATIO   VLDL 53 (H) 0 - 40 mg/dL   LDL Cholesterol 108 (H) 0 - 99 mg/dL    Comment:        Total Cholesterol/HDL:CHD Risk Coronary Heart Disease Risk Table                     Men   Women  1/2 Average Risk   3.4   3.3  Average Risk       5.0   4.4  2 X Average Risk   9.6   7.1  3 X Average Risk  23.4   11.0        Use the calculated Patient Ratio above and the CHD Risk Table to determine the patient's CHD Risk.        ATP III CLASSIFICATION (LDL):  <100     mg/dL   Optimal  100-129  mg/dL   Near or Above                    Optimal  130-159  mg/dL   Borderline  160-189  mg/dL   High  >190     mg/dL   Very High Performed at [facility]   Hemoglobin A1c     Status: Abnormal   Collection Time: 12/24/14  6:48 AM  Result Value Ref Range   Hgb A1c MFr Bld 6.5 (H) 4.8 - 5.6 %    Comment: (NOTE)         Pre-diabetes: 5.7 - 6.4         Diabetes: >6.4         Glycemic control for adults with diabetes: <7.0    Mean Plasma Glucose 140 mg/dL    Comment: (NOTE) Performed At: [facility] [place], [place] [id number] Lindon Romp MD [id number] Performed at [facility]     Physical Findings: AIMS: Facial and Oral Movements Muscles of Facial Expression: None, normal Lips and Perioral Area: None, normal Jaw: None, normal Tongue: None, normal,Extremity  Movements Upper (arms, wrists, hands, fingers): None, normal Lower (legs, knees, ankles, toes): None, normal, Trunk Movements Neck, shoulders, hips: None, normal, Overall Severity Severity of abnormal movements (highest score from questions above): None, normal Incapacitation due to abnormal movements: None, normal Patient's awareness of abnormal movements (rate only patient's report): No Awareness, Dental Status Current problems with teeth and/or dentures?: No Does patient usually wear dentures?: No  CIWA:  CIWA-Ar Total: 1 COWS:  COWS Total Score: 2  Musculoskeletal: Strength & Muscle Tone: within normal limits Gait & Station: walk still slow , but improved with walker, which she is now using  Patient leans: N/A  Psychiatric Specialty Exam: ROS denies chest pain, decreased shortness of breath, pain on ambulation, mostly hips, lower back. No rash.  Blood pressure 119/59, pulse 88, temperature 98.7 F (37.1 C), temperature source Oral, resp. rate 18, height _0  (1.651 m), weight 329 lb (149.233 kg), SpO2 98 %.Body mass index is 54.75 kg/(m^2).  General Appearance: Fairly Groomed  Engineer, water::  Good  Speech:  Normal Rate  Volume:  Normal  Mood:   Improving, does not present manic at this time  Affect:   Appropriate  Thought Process:  Linear  Orientation:  Other:  fully alert and attentive   Thought Content:  denies hallucinations, no delusions  Suicidal Thoughts:  No  Homicidal Thoughts:  Yes- continues to report homicidal ideations towards her brother, and has expressed  thoughts of poisoning him.  Memory:  recent and remote grossly intact   Judgement:  Fair  Insight:  Fair  Psychomotor Activity:  Decreased- no tremors, no diaphoresis  Concentration:  Good  Recall:  Good  Fund of Knowledge:Good  Language: Negative  Akathisia:  No  Handed:  Right  AIMS (if indicated):     Assets:  Desire for Improvement Resilience  ADL's:  Intact  Cognition: WNL  Sleep:  Number of  Hours: 5.75  Assessment - patient  Not presenting manic or overtly irritable at this time. Continues to describe homicidal ideations towards brother, but is less focused on this and is more focused on returning to husband and housing situation. Unwitnessed fall yesterday, no LOC, which staff suspects may have been related to medication seeking behavior, based on reports that she had said she was going to fall to get opiates prescribed Today not reporting increased pain and does not appear to be in any acute distress or discomfort .  Treatment Plan Summary: Daily contact with patient to assess and evaluate symptoms and progress in treatment, Medication management, Plan inpatient treatment  and medications as below  Continue to encourage group, milieu participation to work on  Coping skills and symptom reduction Continue Neurontin 400 mgrs TID to address chronic pain, and may also help anxiety Continue Celexa 10 mgrs QDAY for depression Start Abilify 5 mgrs QDAY for history of Mood Disorder, Bipolarity. Continue Trazodone 50 mgrs QHS PRN for insomnia if needed  Continue Ativan PRNs for potential BZD Withdrawal symptoms if needed  On Norvasc for HTN, Glucophage for DM. Patient currently mobilizing in wheel chair .   COBOS, FERNANDO 12/25/2014, 1:18 PM

## 2014-12-25 NOTE — [facility] Group Notes (Signed)
[facility] 12/25/2014 1:15pm  Type of Therapy: Mental Health Association Presentation  Participation Level: Active  Participation Quality: Distracting  Affect: Appropriate  Cognitive: Oriented  Insight: Developing/Improving  Engagement in Therapy: Engaged  Modes of Intervention: Discussion, Education and Socialization  Summary of Progress/Problems: Mental Health Association (MHA) Speaker came to talk about his personal journey with substance abuse and addiction. The pt processed ways by which to relate to the speaker. MHA speaker provided handouts and educational information pertaining to groups and services offered by the [facility]. Pt was engaged in speaker's presentation and was receptive to resources provided.    Chad CordialLauren Carter, LCSWA 12/25/2014 2:10 PM

## 2014-12-25 NOTE — Plan of Care (Signed)
Problem: Consults Goal: Depression Patient Education See Patient Education Module for education specifics.  Outcome: Progressing Nurse discussed depression/coping skills with patient.        

## 2014-12-25 NOTE — [facility] Group Notes (Signed)
The focus of this group is to educate the patient on the purpose and policies of crisis stabilization and provide a format to answer questions about their admission.  The group details unit policies and expectations of patients while admitted.  Patient did not attend 0900 nurse education orientation group this morning.  Patient stayed in bed resting. 

## 2014-12-26 DIAGNOSIS — F3164 Bipolar disorder, current episode mixed, severe, with psychotic features: Secondary | ICD-10-CM | POA: Diagnosis present

## 2014-12-26 DIAGNOSIS — E785 Hyperlipidemia, unspecified: Secondary | ICD-10-CM | POA: Clinically undetermined

## 2014-12-26 DIAGNOSIS — F4310 Post-traumatic stress disorder, unspecified: Secondary | ICD-10-CM

## 2014-12-26 DIAGNOSIS — F1420 Cocaine dependence, uncomplicated: Secondary | ICD-10-CM | POA: Clinically undetermined

## 2014-12-26 LAB — GLUCOSE, CAPILLARY
Glucose-Capillary: 139 mg/dL — ABNORMAL HIGH (ref 65–99)
Glucose-Capillary: 117 mg/dL — ABNORMAL HIGH (ref 65–99)
Glucose-Capillary: 90 mg/dL (ref 65–99)

## 2014-12-26 MED ORDER — TRAZODONE HCL 100 MG PO TABS
100.0000 mg | ORAL_TABLET | Freq: Every day | ORAL | Status: DC
  Administered 2014-12-26: 100 mg via ORAL
  Filled 2014-12-26 (×3): qty 1

## 2014-12-26 MED ORDER — ARIPIPRAZOLE 15 MG PO TABS
7.5000 mg | ORAL_TABLET | Freq: Every day | ORAL | Status: DC
  Filled 2014-12-26 (×2): qty 1

## 2014-12-26 NOTE — Progress Notes (Signed)
D: Dell denies SI. She has been cooperative this a.m., taking all medications except metformin, which she says is giving her diarrhea. On her self inventory, she reported poor sleep, fair appetite, normal energy, and good concentration. She rated her depression 2, hopelessness 2, and anxiety 2. She wants to "stay on point" and keep positive.  A: Meds given as ordered. Q15 safety checks maintained. Support/encouragement offered. R: Pt remains free from harm and continues with treatment. Will continue to monitor for needs/safety.

## 2014-12-26 NOTE — Progress Notes (Signed)
Patient ID: Candice [name], female   DOB: 17-May-1970, 44 y.o.   MRN: [id number] [name] MD Progress Note  12/26/2014 2:39 PM Candice [name]  MRN:  [id number] Subjective:   Patient reports " I am not sleeping well. I still see shadows , I feel they are out to get me.'  Objective :  I have discussed case with treatment team and have met with patient. Patient is morbidly obese, walks with difficulty. Pt today tries to minimize her sx and is more focussed on getting discharged from hospital. Pt with ongoing stressors like - current housing difficulties. Pt also reports VH - of seeing shadows as well as feeling paranoid that they are out to get her - pt seemed to avoid talking about these shadows , stating that she is afraid she might bring them back by talking about it. She is not presenting with any pressured speech, racing thoughts, nor does she present irritable at this time. She does report sleep issues and it was discussed to increased her sleep medication tonight. Denies medication side effects. Per staff - no disruptive issues noted on the unit.    Principal Problem: Bipolar disorder, curr episode mixed, severe, with psychotic features ([place]) Diagnosis:   Patient Active Problem List   Diagnosis Date Noted  . Bipolar disorder, curr episode mixed, severe, with psychotic features ([place]) [F31.64] 12/26/2014  . Morbid obesity ([place]) [E66.01] 12/26/2014  . Cocaine use disorder, moderate, dependence ([place]) [F14.20] 12/26/2014  . Hyperlipidemia [E78.5] 12/26/2014  . Homicidal ideations [R45.850] 12/22/2014  . Posttraumatic stress disorder [F43.10] 12/22/2014   Total Time spent with patient: 25 minutes   Past Psychiatric History: states she has had prior psychiatric admissions, most recently 2 years ago. Has attempted suicide in the past by overdosing on medications. States she has been diagnosed with Bipolar Disorder and with PTSD . States she does have " a temper problem".     Past Medical History:   Past Medical History  Diagnosis Date  . Asthma   . Bipolar disorder ([place])   . Diabetes mellitus without complication ([place])   . Hypertension       Family History: Pt did not express any.   Social History:  History  Alcohol Use No     History  Drug Use  . Yes  . Special: Marijuana    Social History   Social History  . Marital Status: Widowed    Spouse Name: N/A  . Number of Children: N/A  . Years of Education: N/A   Social History Main Topics  . Smoking status: Former Research scientist (life sciences)  . Smokeless tobacco: None  . Alcohol Use: No  . Drug Use: Yes    Special: Marijuana  . Sexual Activity: Yes    Birth Control/ Protection: Injection   Other Topics Concern  . None   Social History Narrative   Additional Social History:   Sleep: Fair  Appetite:  Good  Current Medications: Current Facility-Administered Medications  Medication Dose Route Frequency Provider Last Rate Last Dose  . acetaminophen (TYLENOL) tablet 650 mg  650 mg Oral Q6H PRN Patrecia Pour, NP      . albuterol (PROVENTIL HFA;VENTOLIN HFA) 108 (90 BASE) MCG/ACT inhaler 1 puff  1 puff Inhalation Q4H PRN Patrecia Pour, NP   1 puff at 12/26/14 1303  . alum & mag hydroxide-simeth (MAALOX/MYLANTA) 200-200-20 MG/5ML suspension 30 mL  30 mL Oral Q4H PRN Patrecia Pour, NP      . amLODipine (NORVASC) tablet 5 mg  5 mg Oral Daily Encarnacion Slates, NP   5 mg at 12/25/14 0737  . [START ON 12/27/2014] ARIPiprazole (ABILIFY) tablet 7.5 mg  7.5 mg Oral QHS Giannie Soliday, MD      . citalopram (CELEXA) tablet 10 mg  10 mg Oral QHS Jenne Campus, MD   10 mg at 12/25/14 2100  . gabapentin (NEURONTIN) capsule 400 mg  400 mg Oral TID Jenne Campus, MD   400 mg at 12/26/14 1207  . hydrochlorothiazide (HYDRODIURIL) tablet 25 mg  25 mg Oral Daily Patrecia Pour, NP   25 mg at 12/26/14 0805  . hydrOXYzine (ATARAX/VISTARIL) tablet 25 mg  25 mg Oral Q6H PRN Jenne Campus, MD   25 mg at 12/25/14 2100  . loperamide (IMODIUM)  capsule 2 mg  2 mg Oral Q4H PRN Benjamine Mola, FNP      . LORazepam (ATIVAN) tablet 1 mg  1 mg Oral Q6H PRN Jenne Campus, MD   1 mg at 12/26/14 1029  . magnesium hydroxide (MILK OF MAGNESIA) suspension 30 mL  30 mL Oral Daily PRN Patrecia Pour, NP      . metFORMIN (GLUCOPHAGE) tablet 500 mg  500 mg Oral Q breakfast Patrecia Pour, NP   500 mg at 12/25/14 0737  . methocarbamol (ROBAXIN) tablet 500 mg  500 mg Oral Q6H PRN Laverle Hobby, PA-C   500 mg at 12/25/14 2100  . mometasone-formoterol (DULERA) 200-5 MCG/ACT inhaler 2 puff  2 puff Inhalation BID Patrecia Pour, NP   2 puff at 12/26/14 0806  . montelukast (SINGULAIR) tablet 10 mg  10 mg Oral QPM Patrecia Pour, NP   10 mg at 12/25/14 1721  . multivitamin with minerals tablet 1 tablet  1 tablet Oral Daily Jenne Campus, MD   1 tablet at 12/26/14 0804  . naproxen (NAPROSYN) tablet 500 mg  500 mg Oral BID WC Laverle Hobby, PA-C   500 mg at 12/26/14 0805  . ondansetron (ZOFRAN-ODT) disintegrating tablet 4 mg  4 mg Oral Q6H PRN Jenne Campus, MD      . pantoprazole (PROTONIX) EC tablet 40 mg  40 mg Oral Daily Patrecia Pour, NP   40 mg at 12/26/14 0805  . thiamine (B-1) injection 100 mg  100 mg Intramuscular Once Jenne Campus, MD   100 mg at 12/23/14 1739  . thiamine (VITAMIN B-1) tablet 100 mg  100 mg Oral Daily Jenne Campus, MD   100 mg at 12/26/14 0804  . traZODone (DESYREL) tablet 100 mg  100 mg Oral QHS Ursula Alert, MD        Lab Results:  Results for orders placed or performed during the hospital encounter of 12/22/14 (from the past 48 hour(s))  Glucose, capillary     Status: Abnormal   Collection Time: 12/24/14  5:28 PM  Result Value Ref Range   Glucose-Capillary 139 (H) 65 - 99 mg/dL   Comment 1 Notify RN    Comment 2 Document in Chart   Glucose, capillary     Status: Abnormal   Collection Time: 12/25/14  6:48 AM  Result Value Ref Range   Glucose-Capillary 117 (H) 65 - 99 mg/dL  Glucose, capillary      Status: None   Collection Time: 12/26/14  6:05 AM  Result Value Ref Range   Glucose-Capillary 90 65 - 99 mg/dL    Physical Findings: AIMS: Facial and Oral Movements Muscles of Facial  Expression: None, normal Lips and Perioral Area: None, normal Jaw: None, normal Tongue: None, normal,Extremity Movements Upper (arms, wrists, hands, fingers): None, normal Lower (legs, knees, ankles, toes): None, normal, Trunk Movements Neck, shoulders, hips: None, normal, Overall Severity Severity of abnormal movements (highest score from questions above): None, normal Incapacitation due to abnormal movements: None, normal Patient's awareness of abnormal movements (rate only patient's report): No Awareness, Dental Status Current problems with teeth and/or dentures?: No Does patient usually wear dentures?: No  CIWA:  CIWA-Ar Total: 1 COWS:  COWS Total Score: 2  Musculoskeletal: Strength & Muscle Tone: within normal limits Gait & Station: walk still slow , but improved with walker, which she is now using  Patient leans: N/A  Psychiatric Specialty Exam: Review of Systems  Psychiatric/Behavioral: Positive for hallucinations. The patient is nervous/anxious and has insomnia.   All other systems reviewed and are negative.  denies chest pain, decreased shortness of breath, pain on ambulation, mostly hips, lower back. No rash.  Blood pressure 162/100, pulse 89, temperature 98.4 F (36.9 C), temperature source Oral, resp. rate 20, height 5' 5"  (1.651 m), weight 149.233 kg (329 lb), SpO2 98 %.Body mass index is 54.75 kg/(m^2).  General Appearance: Fairly Groomed  Engineer, water::  Good  Speech:  Normal Rate  Volume:  Normal  Mood:   Improving, does not present manic at this time  Affect:   Appropriate  Thought Process:  Linear  Orientation:  Other:  fully alert and attentive   Thought Content:  Hallucinations: Visual, Paranoid Ideation and Rumination- shadows  Suicidal Thoughts:  No  Homicidal  Thoughts:denies    Memory:  recent and remote grossly intact , immediate - fair  Judgement:  Fair  Insight:  Fair  Psychomotor Activity:  Decreased- no tremors, no diaphoresis  Concentration:  Good  Recall:  Good  Fund of Knowledge:Good  Language: Negative  Akathisia:  No  Handed:  Right  AIMS (if indicated):     Assets:  Desire for Improvement Resilience  ADL's:  Intact  Cognition: WNL  Sleep:  Number of Hours: 3   Assessment - Patient today continues to report sleep issues as well as VH of shadows and feels paranoid about these shadows. Pt will continue to need medication readjustment and help.   Treatment Plan Summary: Daily contact with patient to assess and evaluate symptoms and progress in treatment, Medication management, Plan inpatient treatment  and medications as below    I have reviewed plan per Dr.Cobos - continuee plan except where noted below. Continue to encourage group, milieu participation to work on  Coping skills and symptom reduction Continue Neurontin 400 mgrs TID to address chronic pain, and may also help anxiety Continue Celexa 10 mgrs QDAY for depression Increase Abilify to  7. 5 mgrs QDAY for psychosis/mood sx. Increase Trazodone to 100  mgrs QHS for insomnia . Continue Ativan PRNs for potential BZD Withdrawal symptoms if needed . Continue home medications where indicated. Reviewed labs- pt with abnormal lipid panel - will consult dietician - she is also morbidly obese. Patient currently mobilizing in wheel chair .   Verbena Boeding MD 12/26/2014, 2:39 PM

## 2014-12-26 NOTE — [facility] Group Notes (Signed)
[facility] LCSW Group Therapy 12/26/2014 1:15pm  Type of Therapy: Group Therapy- Feelings Around Relapse and Recovery  Pt did not attend, declined invitation.   Modes of Intervention: Clarification, Confrontation, Discussion, Education, Exploration, Limit-setting, Orientation, Problem-solving, Rapport Building, Reality Testing, Socialization and Support   Rosemond Lyttle Carter, LCSWA [phone] 12/26/2014 4:56 PM  

## 2014-12-26 NOTE — [facility] Group Notes (Signed)
[facility]  12/26/2014 8:45 AM  Participation Quality: Alert, Appropriate and Oriented  Mood/Affect: Appropriate  Depression Rating: 2  Anxiety Rating: 2  Thoughts of Suicide: Pt denies SI/HI  Will you contract for safety? Yes  Current AVH: Pt denies  Plan for Discharge/Comments: Pt attended discharge planning group and actively participated in group. CSW discussed suicide prevention education with the group and encouraged them to discuss discharge planning and any relevant barriers. Pt reports feeling much better today and denies HI towards brother. Pt reports that she plans to call him to make amends. Pt reports that she wants to DC today.  Transportation Means: Pt reports access to transportation  Supports: No supports mentioned at this time  Candice CordialLauren [name], LCSWA 12/26/2014 10:09 AM

## 2014-12-26 NOTE — Progress Notes (Signed)
Adult Psychoeducational Group Note  Date:  12/26/2014 Time:  08:00pm Group Topic/Focus:  Wrap-Up Group:   The focus of this group is to help patients review their daily goal of treatment and discuss progress on daily workbooks.  Participation Level:  Active  Participation Quality:  Appropriate and Attentive  Affect:  Appropriate  Cognitive:  Alert and Appropriate  Insight: Appropriate  Engagement in Group:  Engaged  Modes of Intervention:  Discussion  Additional Comments:  Pt was attentive and appropriate during tonight's group discussion. Pt stated that today was a good day and she's to report back home. Pt stated that she dont want to harm anyone or herself.   Bing PlumeScott, Darneisha Windhorst D 12/26/2014, 11:23 PM

## 2014-12-26 NOTE — Progress Notes (Addendum)
D: Pt has anxious affect and mood.  She was in bed in her room upon initial approach.  Pt reports her goal is to "get some sleep."  Pt denies SI/HI, denies hallucinations, denies pain.  Pt has stayed in her room for the majority of the night.   A: Introduced self to pt.  Met with pt 1:1 and offered support and encouragement.  Actively listened to pt.  Medications administered per order.  PRN medication administered for sleep, muscle spasms, anxiety.  Fall prevention techniques reviewed with pt.  Pt verbalized understanding. R: Pt is compliant with medications.  Pt verbally contracts for safety.  Will continue to monitor and assess.

## 2014-12-26 NOTE — Plan of Care (Signed)
Problem: Alteration in mood Goal: LTG-Patient reports reduction in suicidal thoughts (Patient reports reduction in suicidal thoughts and is able to verbalize a safety plan for whenever patient is feeling suicidal)  Outcome: Progressing Pt denies SI and verbally contracts for safety.       
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
## 2014-12-26 NOTE — Progress Notes (Signed)
Pt, who normally has a slightly loud voice, began raising her voice when speaking on the phone. "They can't hold me here against my will! I want to go home!" She repeated this a few times. Pt upset that she will not be released today. Spoke with pt and attempted verbal de-escalation. PRN given. Pt urged to try to remain calm and remain with program in order to improve case for discharge. Will continue to monitor for needs/safety.

## 2014-12-27 DIAGNOSIS — F3164 Bipolar disorder, current episode mixed, severe, with psychotic features: Principal | ICD-10-CM

## 2014-12-27 LAB — GLUCOSE, CAPILLARY: Glucose-Capillary: 124 mg/dL — ABNORMAL HIGH (ref 65–99)

## 2014-12-27 MED ORDER — PANTOPRAZOLE SODIUM 40 MG PO TBEC: 40.0000 mg | DELAYED_RELEASE_TABLET | Freq: Every day | ORAL | Status: AC

## 2014-12-27 MED ORDER — GABAPENTIN 400 MG PO CAPS: 400.0000 mg | ORAL_CAPSULE | Freq: Three times a day (TID) | ORAL | Status: AC

## 2014-12-27 MED ORDER — CITALOPRAM HYDROBROMIDE 10 MG PO TABS: 10.0000 mg | ORAL_TABLET | Freq: Every day | ORAL | Status: AC

## 2014-12-27 MED ORDER — TRAZODONE HCL 100 MG PO TABS: 100.0000 mg | ORAL_TABLET | Freq: Every day | ORAL | Status: AC

## 2014-12-27 MED ORDER — HYDROCHLOROTHIAZIDE 25 MG PO TABS: 25.0000 mg | ORAL_TABLET | Freq: Every day | ORAL | Status: AC

## 2014-12-27 MED ORDER — AMLODIPINE BESYLATE 5 MG PO TABS: 5.0000 mg | ORAL_TABLET | Freq: Every day | ORAL | Status: AC

## 2014-12-27 MED ORDER — ARIPIPRAZOLE 15 MG PO TABS: 7.5000 mg | ORAL_TABLET | Freq: Every day | ORAL | Status: AC

## 2014-12-27 NOTE — [facility] Group Notes (Signed)
[facility] Group Notes: (Clinical Social Work)   12/27/2014      Type of Therapy:  Group Therapy at 1:15  Participation Level:  Did Not Attend - discharged already  [facility] Hospital[facility]areida Grossman-Orr, LCSW 12/27/2014, 12:56 PM

## 2014-12-27 NOTE — Progress Notes (Signed)
Patient ID: Candice [name], female   DOB: [birth year], 44 y.o.   MRN: [id number]   Pt currently presents with a flat affect and anxious behavior. Per self inventory, pt rates depression, hopelessness and anxiety at a 0. Pt's daily goal is to "going home" and they intend to do so by "I already made admins so I want to go home." Pt reports fair sleep, a good appetite, normal energy and good concentration.   Pt provided with medications per providers orders. Pt's labs and vitals were monitored throughout the day. Pt supported emotionally and encouraged to express concerns and questions. Pt educated on medications and fall risk precautions/prevention.   Pt's safety ensured with 15 minute and environmental checks. Pt currently denies SI/HI and A/V hallucinations. Pt verbally agrees to seek staff if SI/HI or A/VH occurs and to consult with staff before acting on these thoughts. Pt to be discharged today per MD, [facility]. Will continue POC.

## 2014-12-27 NOTE — Progress Notes (Signed)
  [facility] :  Will you be returning to the same living situation after discharge:  Yes,  with husband At discharge, do you have transportation home?: Yes,  husband to transport Do you have the ability to pay for your medications: Yes,  no issues raised  Release of information consent forms completed and in the chart;  Patient's signature needed at discharge.  Patient to Follow up at: Follow-up Information    Follow up with [facility] On 01/01/2015.   Why:  Please go between 9am-3pm to be seen for your initial assessment for medication management and therapy.   Contact information:   [address] [place] [id number] [phone] Fax: [phone]      Next level of care provider [facility] Link:no  Patient denies SI/HI: Yes,  see doctor discharge SRA    Safety Planning and Suicide Prevention discussed: Yes,  with husband  Have you used any form of tobacco in the last 30 days? (Cigarettes, Smokeless Tobacco, Cigars, and/or Pipes): No  Has patient been referred to the Quitline?: Yes, faxed on 12/27/14  Sarina SerGrossman-Orr, Carlinda Ohlson Jo 12/27/2014, 12:24 PM
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
## 2014-12-27 NOTE — Progress Notes (Signed)
Patient ID: Candice [name], female   DOB: [birth year], 44 y.o.   MRN: [id number]  Pt discharged home with her husband. Pt was stable and appreciative at that time. All papers and prescriptions were given and valuables returned. Verbal understanding expressed. Denies SI/HI and A/VH. Pt given opportunity to express concerns and ask questions.

## 2014-12-27 NOTE — Progress Notes (Signed)
Patient ID: Candice [name], female   DOB: 06/19/1970, 44 y.o.   MRN: [id number]  Adult Psychoeducational Group Note  Date:  12/27/2014 Time: 09:30am  Group Topic/Focus:  Coping With Mental Health Crisis:   The purpose of this group is to help patients identify strategies for coping with mental health crisis.  Group discusses possible causes of crisis and ways to manage them effectively.  Participation Level:  Active  Participation Quality:  Appropriate  Affect:  Appropriate  Cognitive:  Appropriate  Insight: Appropriate  Engagement in Group:  Engaged and Improving  Modes of Intervention:  Activity, Discussion, Education and Support  Additional Comments:  Pt able to identify one wish for this christmas season, for all of her family to be together this year.   Candice [name], Candice [name] 12/27/2014, 10:50 AM

## 2014-12-27 NOTE — Progress Notes (Signed)
D: Pt is alert and oriented x 4. Pt endorses bilateral leg ache of 7 on a 0-10 pain scale. Pt however denies anxiety, depression, SI/H and AVH. She states, "My only problem now is knowing when I would be going home."  Pt also complained of constipation. Pt who is a very high fall risk has refused to use either her walker or her wheelchair. Pt remained calm and cooperative through the shift assessment.  A: Medications administered as prescribed.  Support, encouragement, and safe environment provided.  15-minute safety checks continue.  R: Pt was med compliant.  Pt did attend wrap-up group. Safety checks continue.

## 2014-12-27 NOTE — [facility] Suicide Risk Assessment (Signed)
[facility]   Demographic Factors:  NA  Total Time spent with patient: 45 minutes  Musculoskeletal: Strength & Muscle Tone: within normal limits Gait & Station: walk with walker Patient leans: N/A  Psychiatric Specialty Exam: Physical Exam  ROS  Blood pressure 137/79, pulse 84, temperature 98.6 F (37 C), temperature source Oral, resp. rate 24, height 5\' 5"  (1.651 m), weight 149.233 kg (329 lb), SpO2 98 %.Body mass index is 54.75 kg/(m^2).  General Appearance: Casual  Eye Contact::  Fair  Speech:  Slow409  Volume:  Normal  Mood:  Euthymic  Affect:  Congruent  Thought Process:  Coherent and Intact  Orientation:  Full (Time, Place, and Person)  Thought Content:  WDL  Suicidal Thoughts:  No  Homicidal Thoughts:  No  Memory:  Immediate;   Good Recent;   Good Remote;   Good  Judgement:  Fair  Insight:  Good  Psychomotor Activity:  Normal  Concentration:  Fair  Recall:  Good  Fund of Knowledge:Good  Language: Fair  Akathisia:  No  Handed:  Right  AIMS (if indicated):     Assets:  Communication Skills Desire for Improvement Financial Resources/Insurance Housing Social Support  Sleep:  Number of Hours: 3  Cognition: WNL  ADL's:  Intact   Have you used any form of tobacco in the last 30 days? (Cigarettes, Smokeless Tobacco, Cigars, and/or Pipes): No  Has this patient used any form of tobacco in the last 30 days? (Cigarettes, Smokeless Tobacco, Cigars, and/or Pipes) No  Mental Status Per Nursing Assessment::   On Admission:  Thoughts of violence towards others  Current Mental Status by Physician: NA  Loss Factors: NA  Historical Factors: Prior suicide attempts and Impulsivity  Risk Reduction Factors:   Sense of responsibility to family, Religious beliefs about death, Living with another person, especially a relative, Positive social support, Positive therapeutic relationship and Positive coping skills or problem solving  skills  Continued Clinical Symptoms:  Alcohol/Substance Abuse/Dependencies  Cognitive Features That Contribute To Risk:  None    Suicide Risk:  Minimal: No identifiable suicidal ideation.  Patients presenting with no risk factors but with morbid ruminations; may be classified as minimal risk based on the severity of the depressive symptoms  Principal Problem: Bipolar disorder, curr episode mixed, severe, with psychotic features [facility](HCC) Discharge Diagnoses:  Patient Active Problem List   Diagnosis Date Noted  . Bipolar disorder, curr episode mixed, severe, with psychotic features (HCC) [F31.64] 12/26/2014  . Morbid obesity (HCC) [E66.01] 12/26/2014  . Cocaine use disorder, moderate, dependence (HCC) [F14.20] 12/26/2014  . Hyperlipidemia [E78.5] 12/26/2014  . Homicidal ideations [R45.850] 12/22/2014  . Posttraumatic stress disorder [F43.10] 12/22/2014    Follow-up Information    Follow up with [facility] On 01/01/2015.   Why:  Please go between 9am-3pm to be seen for your initial assessment for medication management and therapy.   Contact information:   [address] [place] [address] [phone] Fax: [phone]      Plan Of Care/Follow-up recommendations:  Activity:  as tolerated Diet:  unchanged from past  Is patient on multiple antipsychotic therapies at discharge:  No   Has Patient had three or more failed trials of antipsychotic monotherapy by history:  No  Recommended Plan for Multiple Antipsychotic Therapies: NA    Chaquita Basques T. 12/27/2014, 12:13 PM

## 2015-01-16 ENCOUNTER — Emergency Department (HOSPITAL_COMMUNITY): Payer: Medicaid Other

## 2015-01-16 ENCOUNTER — Emergency Department (HOSPITAL_COMMUNITY)
Admission: EM | Admit: 2015-01-16 | Discharge: 2015-01-16 | Disposition: A | Discharge status: Home / Self Care | Payer: Medicaid Other | Attending: Emergency Medicine | Admitting: Emergency Medicine

## 2015-01-16 ENCOUNTER — Encounter (HOSPITAL_COMMUNITY): Payer: Self-pay

## 2015-01-16 DIAGNOSIS — W1839XA Other fall on same level, initial encounter: Secondary | ICD-10-CM | POA: Diagnosis not present

## 2015-01-16 DIAGNOSIS — F319 Bipolar disorder, unspecified: Secondary | ICD-10-CM | POA: Insufficient documentation

## 2015-01-16 DIAGNOSIS — Y9289 Other specified places as the place of occurrence of the external cause: Secondary | ICD-10-CM | POA: Diagnosis not present

## 2015-01-16 DIAGNOSIS — Z87891 Personal history of nicotine dependence: Secondary | ICD-10-CM | POA: Diagnosis not present

## 2015-01-16 DIAGNOSIS — I10 Essential (primary) hypertension: Secondary | ICD-10-CM | POA: Diagnosis not present

## 2015-01-16 DIAGNOSIS — Y998 Other external cause status: Secondary | ICD-10-CM | POA: Insufficient documentation

## 2015-01-16 DIAGNOSIS — S3992XA Unspecified injury of lower back, initial encounter: Secondary | ICD-10-CM | POA: Diagnosis not present

## 2015-01-16 DIAGNOSIS — S3991XA Unspecified injury of abdomen, initial encounter: Secondary | ICD-10-CM | POA: Insufficient documentation

## 2015-01-16 DIAGNOSIS — E119 Type 2 diabetes mellitus without complications: Secondary | ICD-10-CM | POA: Insufficient documentation

## 2015-01-16 DIAGNOSIS — R200 Anesthesia of skin: Secondary | ICD-10-CM | POA: Insufficient documentation

## 2015-01-16 DIAGNOSIS — Z79899 Other long term (current) drug therapy: Secondary | ICD-10-CM | POA: Insufficient documentation

## 2015-01-16 DIAGNOSIS — R42 Dizziness and giddiness: Secondary | ICD-10-CM | POA: Insufficient documentation

## 2015-01-16 DIAGNOSIS — M549 Dorsalgia, unspecified: Secondary | ICD-10-CM

## 2015-01-16 DIAGNOSIS — J45909 Unspecified asthma, uncomplicated: Secondary | ICD-10-CM | POA: Diagnosis not present

## 2015-01-16 DIAGNOSIS — Y9389 Activity, other specified: Secondary | ICD-10-CM | POA: Insufficient documentation

## 2015-01-16 LAB — CBC WITH DIFFERENTIAL/PLATELET
BASOS ABS: 0 10*3/uL (ref 0.0–0.1)
BASOS PCT: 1 %
Eosinophils Absolute: 0.5 10*3/uL (ref 0.0–0.7)
Eosinophils Relative: 7 %
HEMATOCRIT: 39.9 % (ref 36.0–46.0)
HEMOGLOBIN: 12.7 g/dL (ref 12.0–15.0)
LYMPHS PCT: 40 %
Lymphs Abs: 2.6 10*3/uL (ref 0.7–4.0)
MCH: 28.6 pg (ref 26.0–34.0)
MCHC: 31.8 g/dL (ref 30.0–36.0)
MCV: 89.9 fL (ref 78.0–100.0)
Monocytes Absolute: 0.5 10*3/uL (ref 0.1–1.0)
Monocytes Relative: 8 %
NEUTROS ABS: 2.9 10*3/uL (ref 1.7–7.7)
NEUTROS PCT: 44 %
Platelets: 328 10*3/uL (ref 150–400)
RBC: 4.44 MIL/uL (ref 3.87–5.11)
RDW: 13.3 % (ref 11.5–15.5)
WBC: 6.5 10*3/uL (ref 4.0–10.5)

## 2015-01-16 LAB — URINALYSIS, ROUTINE W REFLEX MICROSCOPIC
Bilirubin Urine: NEGATIVE
GLUCOSE, UA: NEGATIVE mg/dL
KETONES UR: NEGATIVE mg/dL
Nitrite: NEGATIVE
PH: 5 (ref 5.0–8.0)
Protein, ur: NEGATIVE mg/dL
Specific Gravity, Urine: 1.019 (ref 1.005–1.030)

## 2015-01-16 LAB — URINE MICROSCOPIC-ADD ON

## 2015-01-16 LAB — COMPREHENSIVE METABOLIC PANEL
ALBUMIN: 3.3 g/dL — AB (ref 3.5–5.0)
ALK PHOS: 77 U/L (ref 38–126)
ALT: 24 U/L (ref 14–54)
AST: 29 U/L (ref 15–41)
Anion gap: 9 (ref 5–15)
BILIRUBIN TOTAL: 0.5 mg/dL (ref 0.3–1.2)
BUN: 15 mg/dL (ref 6–20)
CALCIUM: 9 mg/dL (ref 8.9–10.3)
CO2: 24 mmol/L (ref 22–32)
CREATININE: 1.02 mg/dL — AB (ref 0.44–1.00)
Chloride: 107 mmol/L (ref 101–111)
GFR calc Af Amer: >60 mL/min (ref >60)
GLUCOSE: 105 mg/dL — AB (ref 65–99)
Potassium: 4.1 mmol/L (ref 3.5–5.1)
Sodium: 140 mmol/L (ref 135–145)
TOTAL PROTEIN: 6.9 g/dL (ref 6.5–8.1)

## 2015-01-16 LAB — LIPASE, BLOOD: Lipase: 35 U/L (ref 11–51)

## 2015-01-16 LAB — PREGNANCY, URINE: Preg Test, Ur: NEGATIVE

## 2015-01-16 MED ORDER — CYCLOBENZAPRINE HCL 10 MG PO TABS: 10.0000 mg | ORAL_TABLET | Freq: Two times a day (BID) | ORAL | Status: AC | PRN

## 2015-01-16 MED ORDER — MORPHINE SULFATE (PF) 4 MG/ML IV SOLN
4.0000 mg | Freq: Once | INTRAVENOUS | Status: AC
  Administered 2015-01-16: 4 mg via INTRAVENOUS
  Filled 2015-01-16: qty 1

## 2015-01-16 MED ORDER — OXYCODONE-ACETAMINOPHEN 5-325 MG PO TABS
1.0000 | ORAL_TABLET | ORAL | Status: AC | PRN
Start: 2015-01-16 — End: ?

## 2015-01-16 MED ORDER — CYCLOBENZAPRINE HCL 10 MG PO TABS
10.0000 mg | ORAL_TABLET | Freq: Once | ORAL | Status: AC
  Administered 2015-01-16: 10 mg via ORAL
  Filled 2015-01-16: qty 1

## 2015-01-16 MED ORDER — NAPROXEN 500 MG PO TABS: 500.0000 mg | ORAL_TABLET | Freq: Two times a day (BID) | ORAL | Status: AC

## 2015-01-16 NOTE — ED Notes (Signed)
Pt. Stated, "I could not pee yesterday or make a BM"

## 2015-01-16 NOTE — ED Provider Notes (Signed)
CSN: [id number]     Arrival date & time 01/16/15  [id number] History   First MD Initiated Contact with Patient 01/16/15 [phone]     Chief Complaint  Patient presents with  . Back Pain    HPI   Candice [name] is a 44 y.o. female with a PMH of asthma, DM, HTN, bipolar disorder who presents to the ED with low back pain, which she states started yesterday. She reports she fell at behavioral health 2 weeks ago and landed on her back, however did not have pain immediately after. She states her symptoms are constant. She reports movement exacerbates her pain. She has tried motrin and gabapentin at home for symptom relief, which have been minimally effective. She reports her pain radiates to her left abdomen. She denies fever, chills, chest pain, shortness of breath, nausea, vomiting, diarrhea, constipation, dysuria, urgency, frequency. She reports associated intermittent dizziness and left upper arm numbness. She states she has had several episodes of bowel incontinence overnight while sleeping for the past few weeks. She denies history of malignancy, IVDU, anticoagulant use, saddle anesthesia.   Past Medical History  Diagnosis Date  . Asthma   . Bipolar disorder (HCC)   . Diabetes mellitus without complication (HCC)   . Hypertension    History reviewed. No pertinent past surgical history. No family history on file. Social History  Substance Use Topics  . Smoking status: Former Games developermoker  . Smokeless tobacco: None  . Alcohol Use: No   OB History    No data available      Review of Systems  Constitutional: Negative for fever and chills.  Respiratory: Negative for shortness of breath.   Cardiovascular: Negative for chest pain.  Gastrointestinal: Positive for abdominal pain. Negative for nausea, vomiting, diarrhea and constipation.  Genitourinary: Negative for dysuria, urgency and frequency.  Musculoskeletal: Positive for back pain.  Neurological: Positive for dizziness and numbness. Negative for  weakness, light-headedness and headaches.  All other systems reviewed and are negative.     Allergies  Review of patient's allergies indicates no known allergies.  Home Medications   Prior to Admission medications   Medication Sig Start Date End Date Taking? Authorizing Provider  ADVAIR DISKUS 500-50 MCG/DOSE AEPB Inhale 1 puff into the lungs 2 (two) times daily. 10/29/14  Yes Historical Provider, MD  albuterol (PROVENTIL HFA;VENTOLIN HFA) 108 (90 Base) MCG/ACT inhaler Inhale 1-2 puffs into the lungs every 6 (six) hours as needed for wheezing or shortness of breath.   Yes Historical Provider, MD  amLODipine (NORVASC) 5 MG tablet Take 1 tablet (5 mg total) by mouth daily. 12/27/14  Yes Oneta Rackanika N Lewis, NP  ARIPiprazole (ABILIFY) 15 MG tablet Take 0.5 tablets (7.5 mg total) by mouth at bedtime. 12/27/14  Yes Oneta Rackanika N Lewis, NP  citalopram (CELEXA) 10 MG tablet Take 1 tablet (10 mg total) by mouth at bedtime. 12/27/14  Yes Oneta Rackanika N Lewis, NP  clonazePAM (KLONOPIN) 1 MG tablet Take 1 mg by mouth 2 (two) times daily as needed. for anxiety 12/30/14  Yes Historical Provider, MD  gabapentin (NEURONTIN) 400 MG capsule Take 1 capsule (400 mg total) by mouth 3 (three) times daily. 12/27/14  Yes Oneta Rackanika N Lewis, NP  hydrochlorothiazide (HYDRODIURIL) 25 MG tablet Take 1 tablet (25 mg total) by mouth daily. 12/27/14  Yes Oneta Rackanika N Lewis, NP  metFORMIN (GLUCOPHAGE) 500 MG tablet Take 500 mg by mouth daily. 11/24/14  Yes Historical Provider, MD  montelukast (SINGULAIR) 10 MG tablet Take 10 mg by mouth  every evening. 11/24/14  Yes Historical Provider, MD  oxyCODONE (OXY IR/ROXICODONE) 5 MG immediate release tablet Take 5 mg by mouth every 6 (six) hours as needed. for pain 01/01/15  Yes Historical Provider, MD  pantoprazole (PROTONIX) 40 MG tablet Take 1 tablet (40 mg total) by mouth daily. 12/27/14  Yes Oneta Rack, NP  traZODone (DESYREL) 100 MG tablet Take 1 tablet (100 mg total) by mouth at bedtime.  12/27/14  Yes Oneta Rack, NP  cyclobenzaprine (FLEXERIL) 10 MG tablet Take 1 tablet (10 mg total) by mouth 2 (two) times daily as needed for muscle spasms. 01/16/15   Mady Gemma, PA-C  naproxen (NAPROSYN) 500 MG tablet Take 1 tablet (500 mg total) by mouth 2 (two) times daily with a meal. 01/16/15   Mady Gemma, PA-C  oxyCODONE-acetaminophen (PERCOCET/ROXICET) 5-325 MG tablet Take 1 tablet by mouth every 4 (four) hours as needed for severe pain. 01/16/15   Mady Gemma, PA-C    BP 134/78 mmHg  Pulse 74  Temp(Src) 97.9 F (36.6 C) (Oral)  Resp 16  Ht 5' 5.5" (1.664 m)  Wt 154.223 kg  BMI 55.70 kg/m2  SpO2 97% Physical Exam  Constitutional: She is oriented to person, place, and time. She appears well-developed and well-nourished. No distress.  HENT:  Head: Normocephalic and atraumatic.  Right Ear: External ear normal.  Left Ear: External ear normal.  Nose: Nose normal.  Mouth/Throat: Uvula is midline, oropharynx is clear and moist and mucous membranes are normal.  Eyes: Conjunctivae, EOM and lids are normal. Pupils are equal, round, and reactive to light. Right eye exhibits no discharge. Left eye exhibits no discharge. No scleral icterus.  Neck: Normal range of motion. Neck supple.  Cardiovascular: Normal rate, regular rhythm, normal heart sounds, intact distal pulses and normal pulses.   Pulmonary/Chest: Effort normal and breath sounds normal. No respiratory distress. She has no wheezes. She has no rales.  Abdominal: Soft. Normal appearance and bowel sounds are normal. She exhibits no distension and no mass. There is tenderness. There is no rigidity, no rebound and no guarding.  Diffuse TTP of left abdomen.  Musculoskeletal: Normal range of motion. She exhibits tenderness. She exhibits no edema.  TTP of lumbar spine and left lumbar paraspinal muscles. No palpable step-off or deformity.  Neurological: She is alert and oriented to person, place, and time.  She has normal strength. No cranial nerve deficit or sensory deficit.  Strength intact. Sensation to light touch intact.  Skin: Skin is warm, dry and intact. No rash noted. She is not diaphoretic. No erythema. No pallor.  Psychiatric: She has a normal mood and affect. Her speech is normal and behavior is normal.  Nursing note and vitals reviewed.   ED Course  Procedures (including critical care time)  Labs Review Labs Reviewed  COMPREHENSIVE METABOLIC PANEL - Abnormal; Notable for the following:    Glucose, Bld 105 (*)    Creatinine, Ser 1.02 (*)    Albumin 3.3 (*)    All other components within normal limits  URINALYSIS, ROUTINE W REFLEX MICROSCOPIC (NOT AT [facility]) - Abnormal; Notable for the following:    Hgb urine dipstick MODERATE (*)    Leukocytes, UA SMALL (*)    All other components within normal limits  URINE MICROSCOPIC-ADD ON - Abnormal; Notable for the following:    Squamous Epithelial / LPF 0-5 (*)    Bacteria, UA MANY (*)    All other components within normal limits  CBC WITH  DIFFERENTIAL/PLATELET  LIPASE, BLOOD  PREGNANCY, URINE    Imaging Review Dg Lumbar Spine Complete  01/16/2015  CLINICAL DATA:  Fall from standing position 2 weeks ago, lower left-sided back pain ever since, extending to hip. EXAM: LUMBAR SPINE - COMPLETE 4+ VIEW COMPARISON:  None. FINDINGS: Alignment of the lumbar spine is normal. No fracture line or displaced fracture fragment identified. Bone mineralization is normal. Facet joints appear well aligned throughout. No pars interarticularis defect identified. Upper sacrum appears intact and well aligned. Paravertebral soft tissues are unremarkable. IMPRESSION: Negative. Electronically Signed   By: Bary Richard M.D.   On: 01/16/2015 12:16     I have personally reviewed and evaluated these images and lab results as part of my medical decision-making.   EKG Interpretation None      MDM   Final diagnoses:  Back pain, unspecified location     44 year old female presents with low back pain, which started yesterday. Reports fall 2 weeks ago. States her pain radiates to her left abdomen. Denies fever, chills, chest pain, shortness of breath, nausea, vomiting, diarrhea, constipation, dysuria, urgency, frequency. Reports associated intermittent dizziness and left upper arm numbness. States she has had several episodes of bowel incontinence overnight while sleeping for the past few weeks. Denies history of malignancy, IVDU, anticoagulant use, saddle anesthesia.  Patient is afebrile. Vital signs stable. Heart regular rate and rhythm. Lungs clear to auscultation bilaterally. Abdomen soft, nondistended, with mild tenderness to palpation of left abdomen. No rebound, guarding, or masses. Diffuse tenderness to palpation of lumbar spine and left lumbar paraspinal muscles. No palpable step-off or deformity. Strength and sensation intact. Patient able to ambulate with her walker.   Patient given pain medication for symptoms.  CBC negative for leukocytosis or anemia. CMP unremarkable. Lipase within normal limits. UA with moderate hemoglobin, small leukocytes. Urine microscopic with 0-5 WBC, 0-5 RBC. Urine pregnancy negative. Will obtain plain film of lumbar spine. Do not feel MRI is indicated at this time; patient reports bowel incontinence, though states this is only at night while sleeping and has been occurring intermittently over the past few weeks in the absence of back pain. Doubt hematoma, cauda equina, abscess.  Imaging negative for fracture or malalignment. No pars defect identified. Paravertebral soft tissues are unremarkable. Patient reports symptom improvement s/p pain medication. She is nontoxic and well-appearing, feel she is stable for discharge at this time. Will give short course of pain medication as well as anti-inflammatory and muscle relaxant. Return precautions discussed at length. Patient verbalizes her understanding and is in  agreement with plan.  BP 134/78 mmHg  Pulse 74  Temp(Src) 97.9 F (36.6 C) (Oral)  Resp 16  Ht 5' 5.5" (1.664 m)  Wt 154.223 kg  BMI 55.70 kg/m2  SpO2 97%   Mady Gemma, [place] 01/16/15 1313  Laurence Spates, MD 01/17/15 1430

## 2015-01-16 NOTE — ED Notes (Signed)
Pt. Had a fall at behavioral health 2 weeks ago.  She did not have any xrays done at the time.  She felt fine.  The pain in her back has progressively become worse.  The pain radiates down both of her legs.  She denies any numbness or tingling.  Pt. Walks with a walker for her norm.   Pt. Was able to stand and get into the stretcher. Pt. Is alert and oriented X4

## 2015-01-16 NOTE — Discharge Instructions (Signed)
1. Medications: pain medication, naproxen, flexeril, usual home medications 2. Treatment: rest, drink plenty of fluids, do back exercises 3. Follow Up: please followup with your primary doctor next week for discussion of your diagnoses and further evaluation after today's visit; if you do not have a primary care doctor use the resource guide provided to find one; please return to the ER for high fever, numbness, weakness, new or worsening symptoms   Back Exercises The following exercises strengthen the muscles that help to support the back. They also help to keep the lower back flexible. Doing these exercises can help to prevent back pain or lessen existing pain. If you have back pain or discomfort, try doing these exercises 2-3 times each day or as told by your health care provider. When the pain goes away, do them once each day, but increase the number of times that you repeat the steps for each exercise (do more repetitions). If you do not have back pain or discomfort, do these exercises once each day or as told by your health care provider. EXERCISES Single Knee to Chest Repeat these steps 3-5 times for each leg:  Lie on your back on a firm bed or the floor with your legs extended.  Bring one knee to your chest. Your other leg should stay extended and in contact with the floor.  Hold your knee in place by grabbing your knee or thigh.  Pull on your knee until you feel a gentle stretch in your lower back.  Hold the stretch for 10-30 seconds.  Slowly release and straighten your leg. Pelvic Tilt Repeat these steps 5-10 times:  Lie on your back on a firm bed or the floor with your legs extended.  Bend your knees so they are pointing toward the ceiling and your feet are flat on the floor.  Tighten your lower abdominal muscles to press your lower back against the floor. This motion will tilt your pelvis so your tailbone points up toward the ceiling instead of pointing to your feet or the  floor.  With gentle tension and even breathing, hold this position for 5-10 seconds. Cat-Cow Repeat these steps until your lower back becomes more flexible:  Get into a hands-and-knees position on a firm surface. Keep your hands under your shoulders, and keep your knees under your hips. You may place padding under your knees for comfort.  Let your head hang down, and point your tailbone toward the floor so your lower back becomes rounded like the back of a cat.  Hold this position for 5 seconds.  Slowly lift your head and point your tailbone up toward the ceiling so your back forms a sagging arch like the back of a cow.  Hold this position for 5 seconds. Press-Ups Repeat these steps 5-10 times:  Lie on your abdomen (face-down) on the floor.  Place your palms near your head, about shoulder-width apart.  While you keep your back as relaxed as possible and keep your hips on the floor, slowly straighten your arms to raise the top half of your body and lift your shoulders. Do not use your back muscles to raise your upper torso. You may adjust the placement of your hands to make yourself more comfortable.  Hold this position for 5 seconds while you keep your back relaxed.  Slowly return to lying flat on the floor. Bridges Repeat these steps 10 times: 1. Lie on your back on a firm surface. 2. Bend your knees so they are pointing  toward the ceiling and your feet are flat on the floor. 3. Tighten your buttocks muscles and lift your buttocks off of the floor until your waist is at almost the same height as your knees. You should feel the muscles working in your buttocks and the back of your thighs. If you do not feel these muscles, slide your feet 1-2 inches farther away from your buttocks. 4. Hold this position for 3-5 seconds. 5. Slowly lower your hips to the starting position, and allow your buttocks muscles to relax completely. If this exercise is too easy, try doing it with your arms  crossed over your chest. Abdominal Crunches Repeat these steps 5-10 times: 1. Lie on your back on a firm bed or the floor with your legs extended. 2. Bend your knees so they are pointing toward the ceiling and your feet are flat on the floor. 3. Cross your arms over your chest. 4. Tip your chin slightly toward your chest without bending your neck. 5. Tighten your abdominal muscles and slowly raise your trunk (torso) high enough to lift your shoulder blades a tiny bit off of the floor. Avoid raising your torso higher than that, because it can put too much stress on your low back and it does not help to strengthen your abdominal muscles. 6. Slowly return to your starting position. Back Lifts Repeat these steps 5-10 times: 1. Lie on your abdomen (face-down) with your arms at your sides, and rest your forehead on the floor. 2. Tighten the muscles in your legs and your buttocks. 3. Slowly lift your chest off of the floor while you keep your hips pressed to the floor. Keep the back of your head in line with the curve in your back. Your eyes should be looking at the floor. 4. Hold this position for 3-5 seconds. 5. Slowly return to your starting position. SEEK MEDICAL CARE IF:  Your back pain or discomfort gets much worse when you do an exercise.  Your back pain or discomfort does not lessen within 2 hours after you exercise. If you have any of these problems, stop doing these exercises right away. Do not do them again unless your health care provider says that you can. SEEK IMMEDIATE MEDICAL CARE IF:  You develop sudden, severe back pain. If this happens, stop doing the exercises right away. Do not do them again unless your health care provider says that you can.   This information is not intended to replace advice given to you by your health care provider. Make sure you discuss any questions you have with your health care provider.   Document Released: 02/11/2004 Document Revised: 09/24/2014  Document Reviewed: 02/27/2014 Elsevier Interactive Patient Education 2016 [employer]   Back Pain, Adult Back pain is very common in adults.The cause of back pain is rarely dangerous and the pain often gets better over time.The cause of your back pain may not be known. Some common causes of back pain include:  Strain of the muscles or ligaments supporting the spine.  Wear and tear (degeneration) of the spinal disks.  Arthritis.  Direct injury to the back. For many people, back pain may return. Since back pain is rarely dangerous, most people can learn to manage this condition on their own. HOME CARE INSTRUCTIONS Watch your back pain for any changes. The following actions may help to lessen any discomfort you are feeling:  Remain active. It is stressful on your back to sit or stand in one place for long periods  of time. Do not sit, drive, or stand in one place for more than 30 minutes at a time. Take short walks on even surfaces as soon as you are able.Try to increase the length of time you walk each day.  Exercise regularly as directed by your health care provider. Exercise helps your back heal faster. It also helps avoid future injury by keeping your muscles strong and flexible.  Do not stay in bed.Resting more than 1-2 days can delay your recovery.  Pay attention to your body when you bend and lift. The most comfortable positions are those that put less stress on your recovering back. Always use proper lifting techniques, including:  Bending your knees.  Keeping the load close to your body.  Avoiding twisting.  Find a comfortable position to sleep. Use a firm mattress and lie on your side with your knees slightly bent. If you lie on your back, put a pillow under your knees.  Avoid feeling anxious or stressed.Stress increases muscle tension and can worsen back pain.It is important to recognize when you are anxious or stressed and learn ways to manage it, such as with  exercise.  Take medicines only as directed by your health care provider. Over-the-counter medicines to reduce pain and inflammation are often the most helpful.Your health care provider may prescribe muscle relaxant drugs.These medicines help dull your pain so you can more quickly return to your normal activities and healthy exercise.  Apply ice to the injured area:  Put ice in a plastic bag.  Place a towel between your skin and the bag.  Leave the ice on for 20 minutes, 2-3 times a day for the first 2-3 days. After that, ice and heat may be alternated to reduce pain and spasms.  Maintain a healthy weight. Excess weight puts extra stress on your back and makes it difficult to maintain good posture. SEEK MEDICAL CARE IF:  You have pain that is not relieved with rest or medicine.  You have increasing pain going down into the legs or buttocks.  You have pain that does not improve in one week.  You have night pain.  You lose weight.  You have a fever or chills. SEEK IMMEDIATE MEDICAL CARE IF:   You develop new bowel or bladder control problems.  You have unusual weakness or numbness in your arms or legs.  You develop nausea or vomiting.  You develop abdominal pain.  You feel faint.   This information is not intended to replace advice given to you by your health care provider. Make sure you discuss any questions you have with your health care provider.   Document Released: 01/03/2005 Document Revised: 01/24/2014 Document Reviewed: 05/07/2013 Elsevier Interactive Patient Education [employer]
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
